# Patient Record
Sex: Male | Born: 1970 | Race: Black or African American | Hispanic: No | Marital: Single | Smoking: Former smoker
Health system: Southern US, Community
[De-identification: ages and names within clinical notes are randomized; demographics above are authoritative.]

## PROBLEM LIST (undated history)

## (undated) DIAGNOSIS — I10 Essential (primary) hypertension: Secondary | ICD-10-CM

---

## 1999-03-23 ENCOUNTER — Encounter: Payer: Self-pay | Admitting: Emergency Medicine

## 1999-03-23 ENCOUNTER — Emergency Department (HOSPITAL_COMMUNITY): Admission: EM | Admit: 1999-03-23 | Discharge: 1999-03-23 | Payer: Self-pay | Admitting: Emergency Medicine

## 1999-03-29 ENCOUNTER — Emergency Department (HOSPITAL_COMMUNITY): Admission: EM | Admit: 1999-03-29 | Discharge: 1999-03-29 | Payer: Self-pay | Admitting: Emergency Medicine

## 2000-02-28 ENCOUNTER — Emergency Department (HOSPITAL_COMMUNITY): Admission: EM | Admit: 2000-02-28 | Discharge: 2000-02-28 | Payer: Self-pay | Admitting: Emergency Medicine

## 2002-05-19 ENCOUNTER — Emergency Department (HOSPITAL_COMMUNITY): Admission: EM | Admit: 2002-05-19 | Discharge: 2002-05-19 | Payer: Self-pay | Admitting: Emergency Medicine

## 2006-04-04 ENCOUNTER — Encounter: Payer: Self-pay | Admitting: Emergency Medicine

## 2011-03-11 ENCOUNTER — Emergency Department (HOSPITAL_COMMUNITY)
Admission: EM | Admit: 2011-03-11 | Discharge: 2011-03-11 | Disposition: A | Discharge status: Home / Self Care | Payer: Self-pay | Attending: Emergency Medicine | Admitting: Emergency Medicine

## 2011-03-11 DIAGNOSIS — M542 Cervicalgia: Secondary | ICD-10-CM | POA: Insufficient documentation

## 2011-03-11 DIAGNOSIS — Y929 Unspecified place or not applicable: Secondary | ICD-10-CM | POA: Insufficient documentation

## 2011-03-11 DIAGNOSIS — Z1810 Retained metal fragments, unspecified: Secondary | ICD-10-CM | POA: Insufficient documentation

## 2011-03-11 DIAGNOSIS — X58XXXA Exposure to other specified factors, initial encounter: Secondary | ICD-10-CM | POA: Insufficient documentation

## 2011-03-11 DIAGNOSIS — S1190XA Unspecified open wound of unspecified part of neck, initial encounter: Secondary | ICD-10-CM | POA: Insufficient documentation

## 2013-02-28 ENCOUNTER — Emergency Department (HOSPITAL_COMMUNITY)
Admission: EM | Admit: 2013-02-28 | Discharge: 2013-02-28 | Disposition: A | Discharge status: Home / Self Care | Payer: No Typology Code available for payment source | Attending: Emergency Medicine | Admitting: Emergency Medicine

## 2013-02-28 ENCOUNTER — Encounter (HOSPITAL_COMMUNITY): Payer: Self-pay | Admitting: *Deleted

## 2013-02-28 DIAGNOSIS — S0093XA Contusion of unspecified part of head, initial encounter: Secondary | ICD-10-CM

## 2013-02-28 DIAGNOSIS — S4980XA Other specified injuries of shoulder and upper arm, unspecified arm, initial encounter: Secondary | ICD-10-CM | POA: Insufficient documentation

## 2013-02-28 DIAGNOSIS — F17200 Nicotine dependence, unspecified, uncomplicated: Secondary | ICD-10-CM | POA: Insufficient documentation

## 2013-02-28 DIAGNOSIS — S46909A Unspecified injury of unspecified muscle, fascia and tendon at shoulder and upper arm level, unspecified arm, initial encounter: Secondary | ICD-10-CM | POA: Insufficient documentation

## 2013-02-28 DIAGNOSIS — Y92410 Unspecified street and highway as the place of occurrence of the external cause: Secondary | ICD-10-CM | POA: Insufficient documentation

## 2013-02-28 DIAGNOSIS — S0003XA Contusion of scalp, initial encounter: Secondary | ICD-10-CM | POA: Insufficient documentation

## 2013-02-28 DIAGNOSIS — M25511 Pain in right shoulder: Secondary | ICD-10-CM

## 2013-02-28 DIAGNOSIS — Y9389 Activity, other specified: Secondary | ICD-10-CM | POA: Insufficient documentation

## 2013-02-28 MED ORDER — CYCLOBENZAPRINE HCL 10 MG PO TABS: 10.0000 mg | ORAL_TABLET | Freq: Two times a day (BID) | ORAL | Status: DC | PRN
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
## 2013-03-01 NOTE — ED Provider Notes (Signed)
Medical screening examination/treatment/procedure(s) were performed by non-physician practitioner and as supervising physician I was immediately available for consultation/collaboration.   Marquis Diles III, MD 03/01/13 1129 

## 2014-07-03 ENCOUNTER — Encounter (HOSPITAL_COMMUNITY): Payer: Self-pay | Admitting: Emergency Medicine

## 2014-07-03 ENCOUNTER — Emergency Department (HOSPITAL_COMMUNITY): Payer: No Typology Code available for payment source

## 2014-07-03 ENCOUNTER — Emergency Department (HOSPITAL_COMMUNITY)
Admission: EM | Admit: 2014-07-03 | Discharge: 2014-07-03 | Disposition: A | Discharge status: Home / Self Care | Payer: No Typology Code available for payment source | Attending: Emergency Medicine | Admitting: Emergency Medicine

## 2014-07-03 DIAGNOSIS — F17200 Nicotine dependence, unspecified, uncomplicated: Secondary | ICD-10-CM | POA: Insufficient documentation

## 2014-07-03 DIAGNOSIS — I10 Essential (primary) hypertension: Secondary | ICD-10-CM | POA: Insufficient documentation

## 2014-07-03 DIAGNOSIS — M25519 Pain in unspecified shoulder: Secondary | ICD-10-CM | POA: Insufficient documentation

## 2014-07-03 DIAGNOSIS — M25512 Pain in left shoulder: Secondary | ICD-10-CM

## 2014-07-03 HISTORY — DX: Essential (primary) hypertension: I10

## 2014-07-03 MED ORDER — HYDROCODONE-ACETAMINOPHEN 5-325 MG PO TABS: 2.0000 | ORAL_TABLET | ORAL | Status: DC | PRN

## 2014-07-03 MED ORDER — IBUPROFEN 800 MG PO TABS: 800.0000 mg | ORAL_TABLET | Freq: Three times a day (TID) | ORAL | Status: DC

## 2014-07-03 NOTE — ED Provider Notes (Signed)
CSN: [id number]     Arrival date & time 07/03/14  2025 History   First MD Initiated Contact with Patient 07/03/14 2136     Chief Complaint  Patient presents with  . Shoulder Pain     (Consider location/radiation/quality/duration/timing/severity/associated sxs/prior Treatment) Patient is a 43 y.o. male presenting with shoulder pain. The history is provided by the patient. No language interpreter was used.  Shoulder Pain This is a new problem. The current episode started today. The problem occurs constantly. The problem has been unchanged. Nothing aggravates the symptoms. He has tried nothing for the symptoms. The treatment provided no relief.    Past Medical History  Diagnosis Date  . Hypertension    History reviewed. No pertinent past surgical history. History reviewed. No pertinent family history. History  Substance Use Topics  . Smoking status: Current Some Day Smoker  . Smokeless tobacco: Not on file  . Alcohol Use: No     Comment: occ    Review of Systems  All other systems reviewed and are negative.     Allergies  Shellfish allergy  Home Medications   Prior to Admission medications   Not on File   BP 145/88  Pulse 77  Temp(Src) 98.9 F (37.2 C) (Oral)  Resp 18  Ht 6\' 1"  (1.854 m)  Wt 221 lb (100.245 kg)  BMI 29.16 kg/m2  SpO2 100% Physical Exam  Nursing note and vitals reviewed. Constitutional: He is oriented to person, place, and time. He appears well-developed and well-nourished.  Musculoskeletal: He exhibits tenderness.  Neurological: He is alert and oriented to person, place, and time.  Skin: Skin is warm.  Psychiatric: He has a normal mood and affect.    ED Course  Procedures (including critical care time) Labs Review Labs Reviewed - No data to display  Imaging Review Dg Shoulder Left  07/03/2014   CLINICAL DATA:  Shoulder pain  EXAM: LEFT SHOULDER - 2+ VIEW  COMPARISON:  None.  FINDINGS: No fracture or dislocation is seen.  The joint  spaces are preserved.  The visualized soft tissues are unremarkable.  Visualized left lung is clear.  IMPRESSION: No fracture or dislocation is seen.   Electronically Signed   By: Charline BillsSriyesh  Krishnan M.D.   On: 07/03/2014 21:03     EKG Interpretation None      MDM   Final diagnoses:  Shoulder pain, left    Hydrocodone ibuprofen    Elson AreasLeslie K Dessire Grimes, PA-C 07/03/14 2204

## 2014-07-03 NOTE — Discharge Instructions (Signed)

## 2014-07-03 NOTE — ED Notes (Signed)
Pain lt shoulder , onset today Hurts with movement.  Good radial pulse , good grip.No known injury

## 2014-07-03 NOTE — ED Notes (Signed)
Left shoulder pain when pt raises arm or rotates shoulder. States he was not lifting or straining when pain started.

## 2014-07-04 NOTE — ED Provider Notes (Signed)
Medical screening examination/treatment/procedure(s) were performed by non-physician practitioner and as supervising physician I was immediately available for consultation/collaboration.   EKG Interpretation None        Glynn OctaveStephen Lillee Mooneyhan, MD 07/04/14 [phone]

## 2016-08-29 ENCOUNTER — Encounter (HOSPITAL_COMMUNITY): Payer: Self-pay | Admitting: *Deleted

## 2016-08-29 ENCOUNTER — Emergency Department (HOSPITAL_COMMUNITY)
Admission: EM | Admit: 2016-08-29 | Discharge: 2016-08-29 | Disposition: A | Discharge status: Home / Self Care | Payer: BLUE CROSS/BLUE SHIELD | Attending: Emergency Medicine | Admitting: Emergency Medicine

## 2016-08-29 ENCOUNTER — Emergency Department (HOSPITAL_COMMUNITY): Payer: BLUE CROSS/BLUE SHIELD

## 2016-08-29 DIAGNOSIS — Y990 Civilian activity done for income or pay: Secondary | ICD-10-CM | POA: Insufficient documentation

## 2016-08-29 DIAGNOSIS — F17200 Nicotine dependence, unspecified, uncomplicated: Secondary | ICD-10-CM | POA: Diagnosis not present

## 2016-08-29 DIAGNOSIS — I10 Essential (primary) hypertension: Secondary | ICD-10-CM | POA: Insufficient documentation

## 2016-08-29 DIAGNOSIS — M25512 Pain in left shoulder: Secondary | ICD-10-CM | POA: Diagnosis present

## 2016-08-29 DIAGNOSIS — X503XXA Overexertion from repetitive movements, initial encounter: Secondary | ICD-10-CM | POA: Diagnosis not present

## 2016-08-29 DIAGNOSIS — Y929 Unspecified place or not applicable: Secondary | ICD-10-CM | POA: Insufficient documentation

## 2016-08-29 DIAGNOSIS — S46912A Strain of unspecified muscle, fascia and tendon at shoulder and upper arm level, left arm, initial encounter: Secondary | ICD-10-CM

## 2016-08-29 DIAGNOSIS — Y939 Activity, unspecified: Secondary | ICD-10-CM | POA: Diagnosis not present

## 2016-08-29 MED ORDER — TRAMADOL HCL 50 MG PO TABS: 50.0000 mg | ORAL_TABLET | Freq: Four times a day (QID) | ORAL | 0 refills | Status: DC | PRN

## 2016-08-29 MED ORDER — NAPROXEN 500 MG PO TABS
500.0000 mg | ORAL_TABLET | Freq: Two times a day (BID) | ORAL | 0 refills | Status: DC
Start: 2016-08-29 — End: 2017-12-10

## 2016-08-29 NOTE — ED Provider Notes (Signed)
[facility] DEPT Provider Note   CSN: [id number] Arrival date & time: 08/29/16  2048     History   Chief Complaint Chief Complaint  Patient presents with  . Shoulder Pain    HPI Tristan Schaefer is a 45 y.o. male presenting with left shoulder pain which he woke with yesterday morning.  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  He had similar sx 2 years ago which improved with rest and anti inflammatories.  He denies numbness or weakness in his arms or hands and is right handed.  He denies neck and chest pain, denies sob and back pain.  Pain improves at rest and is worsened with arm lifting.  The history is provided by the patient.    Past Medical History:  Diagnosis Date  . Hypertension     There are no active problems to display for this patient.   History reviewed. No pertinent surgical history.     Home Medications    Prior to Admission medications   Medication Sig Start Date End Date Taking? Authorizing Provider  HYDROcodone-acetaminophen (NORCO/VICODIN) 5-325 MG per tablet Take 2 tablets by mouth every 4 (four) hours as needed. 07/03/14   Elson Areas, PA-C  ibuprofen (ADVIL,MOTRIN) 800 MG tablet Take 1 tablet (800 mg total) by mouth 3 (three) times daily. 07/03/14   Elson Areas, PA-C  naproxen (NAPROSYN) 500 MG tablet Take 1 tablet (500 mg total) by mouth 2 (two) times daily. 08/29/16   Burgess Amor, PA-C  traMADol (ULTRAM) 50 MG tablet Take 1 tablet (50 mg total) by mouth every 6 (six) hours as needed for moderate pain. 08/29/16   Burgess Amor, PA-C    Family History History reviewed. No pertinent family history.  Social History Social History  Substance Use Topics  . Smoking status: Current Some Day Smoker  . Smokeless tobacco: Current User  . Alcohol use No     Comment: occ     Allergies   Shellfish allergy   Review of Systems Review of Systems   Constitutional: Negative for fever.  Respiratory: Negative for chest tightness and shortness of breath.   Cardiovascular: Negative for chest pain.  Musculoskeletal: Positive for arthralgias. Negative for back pain, joint swelling and myalgias.  Neurological: Negative for weakness and numbness.     Physical Exam Updated Vital Signs BP 127/82 (BP Location: Left Arm)   Pulse (!) 50   Temp 99.2 F (37.3 C) (Oral)   Resp 18   Ht 6\' 1"  (1.854 m)   Wt 99.8 kg   SpO2 99%   BMI 29.03 kg/m   Physical Exam  Constitutional: He appears well-developed and well-nourished.  HENT:  Head: Atraumatic.  Neck: Normal range of motion.  Cardiovascular:  Pulses equal bilaterally  Musculoskeletal: He exhibits tenderness.       Left shoulder: He exhibits tenderness. He exhibits no swelling, no effusion, no spasm, normal pulse and normal strength.  ttp over left deltoid at the origin.  No palpable deformity.  Pain with active but not passive ROM.  No crepitus or edema.   Neurological: He is alert. He has normal strength. He displays normal reflexes. No sensory deficit.  Skin: Skin is warm and dry.  Psychiatric: He has a normal mood and affect.     ED Treatments / Results  Labs (all labs ordered are listed, but only abnormal results are displayed)  Labs Reviewed - No data to display  EKG  EKG Interpretation None       Radiology Dg Shoulder Left  Result Date: 08/30/2016 CLINICAL DATA:  45 year old male with left shoulder pain. EXAM: LEFT SHOULDER - 2+ VIEW COMPARISON:  Radiograph dated 07/03/2014 FINDINGS: There is no evidence of fracture or dislocation. There is no evidence of arthropathy or other focal bone abnormality. Soft tissues are unremarkable. IMPRESSION: Negative. Electronically Signed   By: Elgie CollardArash  Radparvar M.D.   On: 08/30/2016 00:51          Procedures Procedures (including critical care time)  Medications Ordered in ED Medications - No data to display   Initial  Impression / Assessment and Plan / ED Course  I have reviewed the triage vital signs and the nursing notes.  Pertinent labs & imaging results that were available during my care of the patient were reviewed by me and considered in my medical decision making (see chart for details).  Clinical Course    Suspect deltoid tendon strain, probably from chronic overuse. Naproxen, ice tx, tramadol.  Referral to ortho for f/u care if sx persist or worsen.   Final Clinical Impressions(s) / ED Diagnoses   Final diagnoses:  Left shoulder strain, initial encounter    New Prescriptions New Prescriptions   NAPROXEN (NAPROSYN) 500 MG TABLET    Take 1 tablet (500 mg total) by mouth 2 (two) times daily.   TRAMADOL (ULTRAM) 50 MG TABLET    Take 1 tablet (50 mg total) by mouth every 6 (six) hours as needed for moderate pain.     Burgess AmorJulie Mikaila Grunert, PA-C 08/30/16 0112    Shaune Pollackameron Isaacs, MD 08/30/16 [phone]

## 2016-08-29 NOTE — ED Triage Notes (Signed)
Pt reports 6/10 pain to left shoulder without known injury x 2 days.  Pt says should just started hurting. Pt states hurts worse when elevated.

## 2016-08-29 NOTE — Discharge Instructions (Signed)
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  Use the medicines prescribed and also try applying ice to your shoulder for 10 minute increments every few hours for the next several days.  Avoid activities that worsen your pain. Your xrays are negative tonight for bony injury, arthritis or other visible injuries in your shoulder.

## 2017-01-11 ENCOUNTER — Encounter (HOSPITAL_COMMUNITY): Payer: Self-pay | Admitting: Emergency Medicine

## 2017-01-11 ENCOUNTER — Emergency Department (HOSPITAL_COMMUNITY)
Admission: EM | Admit: 2017-01-11 | Discharge: 2017-01-11 | Disposition: A | Discharge status: Home / Self Care | Payer: BLUE CROSS/BLUE SHIELD | Attending: Emergency Medicine | Admitting: Emergency Medicine

## 2017-01-11 DIAGNOSIS — F17200 Nicotine dependence, unspecified, uncomplicated: Secondary | ICD-10-CM | POA: Insufficient documentation

## 2017-01-11 DIAGNOSIS — K029 Dental caries, unspecified: Secondary | ICD-10-CM | POA: Insufficient documentation

## 2017-01-11 DIAGNOSIS — I10 Essential (primary) hypertension: Secondary | ICD-10-CM | POA: Diagnosis not present

## 2017-01-11 DIAGNOSIS — K0889 Other specified disorders of teeth and supporting structures: Secondary | ICD-10-CM | POA: Diagnosis present

## 2017-01-11 MED ORDER — KETOROLAC TROMETHAMINE 30 MG/ML IJ SOLN
30.0000 mg | Freq: Once | INTRAMUSCULAR | Status: AC
  Administered 2017-01-11: 30 mg via INTRAVENOUS
  Filled 2017-01-11: qty 1

## 2017-01-11 MED ORDER — OXYCODONE-ACETAMINOPHEN 5-325 MG PO TABS
1.0000 | ORAL_TABLET | Freq: Once | ORAL | Status: AC
  Administered 2017-01-11: 1 via ORAL
  Filled 2017-01-11: qty 1

## 2017-01-11 MED ORDER — HYDROCODONE-ACETAMINOPHEN 5-325 MG PO TABS: ORAL_TABLET | ORAL | 0 refills | Status: DC

## 2017-01-11 MED ORDER — CLINDAMYCIN PHOSPHATE 600 MG/50ML IV SOLN
600.0000 mg | Freq: Once | INTRAVENOUS | Status: AC
  Administered 2017-01-11: 600 mg via INTRAVENOUS
  Filled 2017-01-11: qty 50

## 2017-01-11 MED ORDER — CLINDAMYCIN HCL 300 MG PO CAPS: 300.0000 mg | ORAL_CAPSULE | Freq: Four times a day (QID) | ORAL | 0 refills | Status: DC

## 2017-01-11 MED ORDER — IBUPROFEN 800 MG PO TABS: 800.0000 mg | ORAL_TABLET | Freq: Three times a day (TID) | ORAL | 0 refills | Status: DC

## 2017-01-11 NOTE — ED Triage Notes (Signed)
C/o dental pain to right upper tooth since Monday, rates pain 7/10.  Area swollen and tender.

## 2017-01-11 NOTE — Discharge Instructions (Signed)
Follow-up with a dentist soon.   °

## 2017-01-11 NOTE — ED Provider Notes (Signed)
[facility] Provider Note   CSN: [id number] Arrival date & time: 01/11/17  1538     History   Chief Complaint Chief Complaint  Patient presents with  . Dental Pain    right upper    HPI Tristan Schaefer is a 46 y.o. male.  HPI  Tristan Schaefer is a 46 y.o. male who presents to the Emergency Department complaining of dental pain and facial swelling.  Reports having increasing pain to his upper right front tooth.  Increasing swelling to his upper lip and right face.  He also complains of having a foul taste in his mouth.  Pain to his front teeth with chewing.  He denies fever, chills, neck pain or difficulty opening or closing his mouth.  Does not have a dentist.   Past Medical History:  Diagnosis Date  . Hypertension     There are no active problems to display for this patient.   History reviewed. No pertinent surgical history.     Home Medications    Prior to Admission medications   Medication Sig Start Date End Date Taking? Authorizing Provider  HYDROcodone-acetaminophen (NORCO/VICODIN) 5-325 MG per tablet Take 2 tablets by mouth every 4 (four) hours as needed. 07/03/14   Elson Areas, PA-C  ibuprofen (ADVIL,MOTRIN) 800 MG tablet Take 1 tablet (800 mg total) by mouth 3 (three) times daily. 07/03/14   Elson Areas, PA-C  naproxen (NAPROSYN) 500 MG tablet Take 1 tablet (500 mg total) by mouth 2 (two) times daily. 08/29/16   Burgess Amor, PA-C  traMADol (ULTRAM) 50 MG tablet Take 1 tablet (50 mg total) by mouth every 6 (six) hours as needed for moderate pain. 08/29/16   Burgess Amor, PA-C    Family History History reviewed. No pertinent family history.  Social History Social History  Substance Use Topics  . Smoking status: Current Some Day Smoker  . Smokeless tobacco: Current User  . Alcohol use No     Comment: occ     Allergies   Shellfish allergy   Review of Systems Review of Systems  Constitutional: Negative for appetite change and fever.  HENT:  Positive for dental problem and facial swelling. Negative for congestion, sore throat and trouble swallowing.   Eyes: Negative for pain and visual disturbance.  Musculoskeletal: Negative for neck pain and neck stiffness.  Neurological: Negative for dizziness, facial asymmetry and headaches.  Hematological: Negative for adenopathy.  All other systems reviewed and are negative.    Physical Exam Updated Vital Signs BP 148/93 (BP Location: Left Arm)   Pulse (!) 56   Temp 99.1 F (37.3 C) (Oral)   Resp 16   Ht 6\' 1"  (1.854 m)   Wt 102.5 kg   SpO2 99%   BMI 29.82 kg/m   Physical Exam  Constitutional: He is oriented to person, place, and time. He appears well-developed and well-nourished. No distress.  HENT:  Head: Normocephalic and atraumatic.  Right Ear: Tympanic membrane and ear canal normal.  Left Ear: Tympanic membrane and ear canal normal.  Mouth/Throat: Uvula is midline, oropharynx is clear and moist and mucous membranes are normal. No trismus in the jaw. Dental caries present. No dental abscesses or uvula swelling.  tenderness and dental caries of the right upper central incisor.  Moderate facial edema to just below the right nostril and extending to the nasolabial fold. No fluctuance of the gingiva,  trismus, or sublingual abnml.    Neck: Normal range of motion. Neck supple.  Cardiovascular:  Normal rate, regular rhythm and normal heart sounds.   No murmur heard. Pulmonary/Chest: Effort normal and breath sounds normal.  Musculoskeletal: Normal range of motion.  Lymphadenopathy:    He has no cervical adenopathy.  Neurological: He is alert and oriented to person, place, and time. He exhibits normal muscle tone. Coordination normal.  Skin: Skin is warm and dry.  Nursing note and vitals reviewed.    ED Treatments / Results  Labs (all labs ordered are listed, but only abnormal results are displayed) Labs Reviewed - No data to display  EKG  EKG Interpretation None        Radiology No results found.  Procedures Procedures (including critical care time)  Medications Ordered in ED Medications  clindamycin (CLEOCIN) IVPB 600 mg (0 mg Intravenous Stopped 01/11/17 1848)  ketorolac (TORADOL) 30 MG/ML injection 30 mg (30 mg Intravenous Given 01/11/17 1805)  oxyCODONE-acetaminophen (PERCOCET/ROXICET) 5-325 MG per tablet 1 tablet (1 tablet Oral Given 01/11/17 1805)     Initial Impression / Assessment and Plan / ED Course  I have reviewed the triage vital signs and the nursing notes.  Pertinent labs & imaging results that were available during my care of the patient were reviewed by me and considered in my medical decision making (see chart for details).     Pt with a dental caries and likely dental abscess, nothing to I&D.  Airway is patent.  No concerning sx's for Ludwig's angina.  IV clinda given.  Pt appears stable for d/c.  Agrees to close dental f/u  Final Clinical Impressions(s) / ED Diagnoses   Final diagnoses:  Pain, dental    New Prescriptions Discharge Medication List as of 01/11/2017  6:46 PM    START taking these medications   Details  clindamycin (CLEOCIN) 300 MG capsule Take 1 capsule (300 mg total) by mouth 4 (four) times daily., Starting Thu 01/11/2017, [employer]         Subrena Devereux, PA-C 01/13/17 2220    Marily MemosJason Mesner, MD 01/22/17 [phone]

## 2017-12-10 ENCOUNTER — Encounter (HOSPITAL_COMMUNITY): Payer: Self-pay | Admitting: Emergency Medicine

## 2017-12-10 ENCOUNTER — Other Ambulatory Visit: Payer: Self-pay

## 2017-12-10 ENCOUNTER — Emergency Department (HOSPITAL_COMMUNITY)
Admission: EM | Admit: 2017-12-10 | Discharge: 2017-12-11 | Disposition: A | Discharge status: Home / Self Care | Payer: BLUE CROSS/BLUE SHIELD | Attending: Emergency Medicine | Admitting: Emergency Medicine

## 2017-12-10 ENCOUNTER — Emergency Department (HOSPITAL_COMMUNITY): Payer: BLUE CROSS/BLUE SHIELD

## 2017-12-10 DIAGNOSIS — J069 Acute upper respiratory infection, unspecified: Secondary | ICD-10-CM | POA: Insufficient documentation

## 2017-12-10 DIAGNOSIS — R0789 Other chest pain: Secondary | ICD-10-CM | POA: Insufficient documentation

## 2017-12-10 DIAGNOSIS — I10 Essential (primary) hypertension: Secondary | ICD-10-CM | POA: Insufficient documentation

## 2017-12-10 DIAGNOSIS — F17200 Nicotine dependence, unspecified, uncomplicated: Secondary | ICD-10-CM | POA: Insufficient documentation

## 2017-12-10 DIAGNOSIS — B349 Viral infection, unspecified: Secondary | ICD-10-CM | POA: Diagnosis not present

## 2017-12-10 DIAGNOSIS — R079 Chest pain, unspecified: Secondary | ICD-10-CM | POA: Diagnosis present

## 2017-12-10 LAB — BASIC METABOLIC PANEL
ANION GAP: 10 (ref 5–15)
BUN: 10 mg/dL (ref 6–20)
CHLORIDE: 105 mmol/L (ref 101–111)
CO2: 26 mmol/L (ref 22–32)
Calcium: 9.1 mg/dL (ref 8.9–10.3)
Creatinine, Ser: 1 mg/dL (ref 0.61–1.24)
GFR calc Af Amer: >60 mL/min (ref >60)
Glucose, Bld: 101 mg/dL — ABNORMAL HIGH (ref 65–99)
POTASSIUM: 3.8 mmol/L (ref 3.5–5.1)
SODIUM: 141 mmol/L (ref 135–145)

## 2017-12-10 LAB — CBC
HEMATOCRIT: 42.8 % (ref 39.0–52.0)
HEMOGLOBIN: 14.1 g/dL (ref 13.0–17.0)
MCH: 28.7 pg (ref 26.0–34.0)
MCHC: 32.9 g/dL (ref 30.0–36.0)
MCV: 87.2 fL (ref 78.0–100.0)
Platelets: 228 10*3/uL (ref 150–400)
RBC: 4.91 MIL/uL (ref 4.22–5.81)
RDW: 14 % (ref 11.5–15.5)
WBC: 7.6 10*3/uL (ref 4.0–10.5)

## 2017-12-10 LAB — TROPONIN I: Troponin I: <0.03 ng/mL (ref <0.03)

## 2017-12-10 MED ORDER — KETOROLAC TROMETHAMINE 30 MG/ML IJ SOLN
30.0000 mg | Freq: Once | INTRAMUSCULAR | Status: DC
  Filled 2017-12-10: qty 1

## 2017-12-10 NOTE — ED Provider Notes (Signed)
[facility] EMERGENCY DEPARTMENT Provider Note   CSN: [id number] Arrival date & time: 12/10/17  2038     History   Chief Complaint Chief Complaint  Patient presents with  . Chest Pain    HPI Tristan Schaefer is a 47 y.o. male.  HPI  This 47 year old male who presents with chest pain.  Patient reports onset of chest pain yesterday.  He reports that it is right-sided radiates into his right arm.  It is been somewhat constant.  Better today than yesterday.  Not worse with exertion.  No modifying or alleviating factors.  It is somewhat pleuritic in nature.  Does report 1 week history of increasing respiratory congestion.  Reports cough that is nonproductive.  No fevers.  Denies history of high blood pressure high cholesterol, diabetes.  No history of long travel, hospitalization, leg swelling, history of blood clots.  Past Medical History:  Diagnosis Date  . Hypertension     There are no active problems to display for this patient.   History reviewed. No pertinent surgical history.     Home Medications    Prior to Admission medications   Medication Sig Start Date End Date Taking? Authorizing Provider  Diphenhydramine-PE-APAP (NIGHTTIME COLD & FLU MAX STR) 12.5-5-325 MG/10ML LIQD Take 15 mLs by mouth at bedtime as needed (for cold and flu symptoms).   Yes [provider]  naproxen (NAPROSYN) 500 MG tablet Take 1 tablet (500 mg total) by mouth 2 (two) times daily. 12/11/17   Nahal Wanless, Mayer Masker, MD    Family History History reviewed. No pertinent family history.  Social History Social History   Tobacco Use  . Smoking status: Current Some Day Smoker  . Smokeless tobacco: Current User  Substance Use Topics  . Alcohol use: No    Comment: occ  . Drug use: Yes    Types: Marijuana     Allergies   Shellfish allergy   Review of Systems Review of Systems  Constitutional: Negative for fever.  Respiratory: Positive for cough. Negative for shortness of breath.     Cardiovascular: Positive for chest pain. Negative for leg swelling.  Gastrointestinal: Negative for abdominal pain, nausea and vomiting.  All other systems reviewed and are negative.    Physical Exam Updated Vital Signs BP 121/89   Pulse (!) 58   Resp 16   Ht 6\' 1"  (1.854 m)   Wt 102.1 kg (225 lb)   SpO2 99%   BMI 29.69 kg/m   Physical Exam  Constitutional: He is oriented to person, place, and time. He appears well-developed and well-nourished.  HENT:  Head: Normocephalic and atraumatic.  Eyes: Pupils are equal, round, and reactive to light.  Neck: Neck supple.  Cardiovascular: Normal rate, normal heart sounds and normal pulses. An irregularly irregular rhythm present.  No murmur heard. Pulmonary/Chest: Effort normal and breath sounds normal. No respiratory distress. He has no wheezes.  Abdominal: Soft. Bowel sounds are normal. There is no tenderness. There is no rebound.  Musculoskeletal: He exhibits no edema.       Right lower leg: He exhibits no edema.       Left lower leg: He exhibits no edema.  Lymphadenopathy:    He has no cervical adenopathy.  Neurological: He is alert and oriented to person, place, and time.  Skin: Skin is warm and dry.  Psychiatric: He has a normal mood and affect.  Nursing note and vitals reviewed.    ED Treatments / Results  Labs (all labs ordered  are listed, but only abnormal results are displayed) Labs Reviewed  BASIC METABOLIC PANEL - Abnormal; Notable for the following components:      Result Value   Glucose, Bld 101 (*)    All other components within normal limits  CBC  TROPONIN I  TROPONIN I  D-DIMER, QUANTITATIVE (NOT AT [facility])    EKG  EKG Interpretation  Date/Time:  Monday December 10 2017 20:47:32 EST Ventricular Rate:  54 PR Interval:  140 QRS Duration: 94 QT Interval:  414 QTC Calculation: 392 R Axis:   97 Text Interpretation:  Sinus rhythm with Blocked Premature atrial complexes Rightward axis Nonspecific T wave  abnormality Abnormal ECG Confirmed by Ross MarcusHorton, Vernetta Dizdarevic ([id number]) on 12/10/2017 10:49:36 PM       Radiology Dg Chest 2 View  Result Date: 12/10/2017 CLINICAL DATA:  Right-sided chest pain beneath the breast extending into the right arm. Dyspnea and cough. EXAM: CHEST  2 VIEW COMPARISON:  None available on PACs FINDINGS: The heart size and mediastinal contours are within normal limits. Both lungs are clear. The visualized skeletal structures are unremarkable. IMPRESSION: No active cardiopulmonary disease. Electronically Signed   By: Tollie Ethavid  Kwon M.D.   On: 12/10/2017 21:03    Procedures Procedures (including critical care time)  Medications Ordered in ED Medications  ketorolac (TORADOL) 30 MG/ML injection 30 mg (30 mg Intramuscular Refused 12/10/17 2345)  naproxen (NAPROSYN) tablet 500 mg (not administered)     Initial Impression / Assessment and Plan / ED Course  I have reviewed the triage vital signs and the nursing notes.  Pertinent labs & imaging results that were available during my care of the patient were reviewed by me and considered in my medical decision making (see chart for details).     Patient presents with chest pain.  Ongoing since yesterday.  Does report some upper respiratory symptoms and cough.  He is overall nontoxic appearing.  Vital signs reassuring.  He is in sinus rhythm and sinus bradycardia on the monitor with PACs.  No significant evidence of ischemia on EKGs.  Doubt ACS as he is low risk.  Initial troponin and repeat troponin are negative.  Given pleuritic nature and no obvious infiltrate on chest x-ray, d-dimer was sent.  This is negative.  Low suspicion for PE.  Will treat with a trial of naproxen.  After history, exam, and medical workup I feel the patient has been appropriately medically screened and is safe for discharge home. Pertinent diagnoses were discussed with the patient. Patient was given return precautions.   Final Clinical Impressions(s) / ED  Diagnoses   Final diagnoses:  Atypical chest pain  Viral upper respiratory tract infection    ED Discharge Orders        Ordered    naproxen (NAPROSYN) 500 MG tablet  2 times daily     12/11/17 0105       Rondale Nies, Mayer Maskerourtney F, MD 12/11/17 [phone]

## 2017-12-10 NOTE — ED Triage Notes (Addendum)
Pt c/o of right sided chest pain starting yesterday. Pt has had a productive cough x3 days. No fever. Pt states worsening pain when taking a deep breath.

## 2017-12-11 LAB — TROPONIN I: Troponin I: <0.03 ng/mL (ref <0.03)

## 2017-12-11 LAB — D-DIMER, QUANTITATIVE (NOT AT ARMC)

## 2017-12-11 MED ORDER — NAPROXEN 500 MG PO TABS: 500.0000 mg | ORAL_TABLET | Freq: Two times a day (BID) | ORAL | 0 refills | Status: DC

## 2017-12-11 MED ORDER — NAPROXEN 250 MG PO TABS
500.0000 mg | ORAL_TABLET | Freq: Once | ORAL | Status: AC
  Administered 2017-12-11: 500 mg via ORAL
  Filled 2017-12-11: qty 2

## 2017-12-11 NOTE — Discharge Instructions (Addendum)
He was seen today for chest pain.  This may be related to your upper respiratory symptoms and chest wall inflammation.  Try naproxen.  Monitor symptoms for several days.  If you have any new or worsening symptoms you should be reevaluated.

## 2018-10-08 ENCOUNTER — Emergency Department (HOSPITAL_COMMUNITY)
Admission: EM | Admit: 2018-10-08 | Discharge: 2018-10-08 | Disposition: A | Discharge status: Home / Self Care | Payer: BLUE CROSS/BLUE SHIELD | Attending: Emergency Medicine | Admitting: Emergency Medicine

## 2018-10-08 ENCOUNTER — Other Ambulatory Visit: Payer: Self-pay

## 2018-10-08 ENCOUNTER — Encounter (HOSPITAL_COMMUNITY): Payer: Self-pay | Admitting: Emergency Medicine

## 2018-10-08 DIAGNOSIS — I10 Essential (primary) hypertension: Secondary | ICD-10-CM | POA: Insufficient documentation

## 2018-10-08 DIAGNOSIS — M545 Low back pain, unspecified: Secondary | ICD-10-CM

## 2018-10-08 DIAGNOSIS — F17210 Nicotine dependence, cigarettes, uncomplicated: Secondary | ICD-10-CM | POA: Insufficient documentation

## 2018-10-08 DIAGNOSIS — Z79899 Other long term (current) drug therapy: Secondary | ICD-10-CM | POA: Insufficient documentation

## 2018-10-08 MED ORDER — IBUPROFEN 600 MG PO TABS: 600.0000 mg | ORAL_TABLET | Freq: Four times a day (QID) | ORAL | 0 refills | Status: DC | PRN

## 2018-10-08 NOTE — ED Provider Notes (Signed)
[facility] EMERGENCY DEPARTMENT Provider Note   CSN: [id number] Arrival date & time: 10/08/18  1926     History   Chief Complaint Chief Complaint  Patient presents with  . Back Pain    HPI Tristan Schaefer is a 47 y.o. male who presents with low back pain. He states that it started yesterday and has gradually worsened today. He isn't sure what is wrong. 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 The pain is across the lower back but worse in the middle. He took 400mg  Ibuprofen at home. He decided to come to the ED because he was worried about a pinched nerve. No radiation of pain in to the legs. No fever, syncope, trauma, unexplained weight loss, hx of cancer, loss of bowel/bladder function, saddle anesthesia, urinary retention, IVDU.  HPI  Past Medical History:  Diagnosis Date  . Hypertension     There are no active problems to display for this patient.   History reviewed. No pertinent surgical history.      Home Medications    Prior to Admission medications   Medication Sig Start Date End Date Taking? Authorizing Provider  Diphenhydramine-PE-APAP (NIGHTTIME COLD & FLU MAX STR) 12.5-5-325 MG/10ML LIQD Take 15 mLs by mouth at bedtime as needed (for cold and flu symptoms).    [provider]  naproxen (NAPROSYN) 500 MG tablet Take 1 tablet (500 mg total) by mouth 2 (two) times daily. 12/11/17   Horton, Mayer Masker, MD    Family History History reviewed. No pertinent family history.  Social History Social History   Tobacco Use  . Smoking status: Current Some Day Smoker  . Smokeless tobacco: Current User  Substance Use Topics  . Alcohol use: No    Comment: occ  . Drug use: Yes    Types: Marijuana     Allergies   Shellfish allergy   Review of Systems Review of Systems  Genitourinary: Negative for difficulty urinating.    Musculoskeletal: Positive for back pain.  Neurological: Negative for weakness and numbness.     Physical Exam Updated Vital Signs BP 132/68 (BP Location: Right Arm)   Pulse (!) 51   Temp 98.6 F (37 C) (Temporal)   Resp 18   Ht 6\' 1"  (1.854 m)   Wt 99.8 kg   SpO2 98%   BMI 29.03 kg/m   Physical Exam  Constitutional: He is oriented to person, place, and time. He appears well-developed and well-nourished. No distress.  HENT:  Head: Normocephalic and atraumatic.  Eyes: Pupils are equal, round, and reactive to light. Conjunctivae are normal. Right eye exhibits no discharge. Left eye exhibits no discharge. No scleral icterus.  Neck: Normal range of motion.  Cardiovascular: Normal rate.  Pulmonary/Chest: Effort normal. No respiratory distress.  Abdominal: He exhibits no distension.  Musculoskeletal:  Back: Inspection: No masses, deformity, or rash Palpation: Mild upper lumbar midline tenderness. No paraspinal muscle tenderness Strength: 5/5 in lower extremities and normal plantar and dorsiflexion Sensation: Intact sensation with light touch in lower extremities bilaterally SLR: Negative seated straight leg raise Gait: Normal gait   Neurological: He is alert and oriented to person, place, and time.  Skin: Skin is warm and dry.  Psychiatric: He has a normal mood and affect. His behavior is normal.  Nursing note and vitals reviewed.    ED Treatments /  Results  Labs (all labs ordered are listed, but only abnormal results are displayed) Labs Reviewed - No data to display  EKG None  Radiology No results found.  Procedures Procedures (including critical care time)  Medications Ordered in ED Medications - No data to display   Initial Impression / Assessment and Plan / ED Course  I have reviewed the triage vital signs and the nursing notes.  Pertinent labs & imaging results that were available during my care of the patient were reviewed by me and considered in my  medical decision making (see chart for details).  47 year old male with atraumatic low back pain for one day. Likely muscle strain. No red flags in history or on exam. Pain seems mild and he has normal strength and is ambulatory. He was advised to continue Ibuprofen. He requests an rx for this. He was offered a muscle relaxer but declined. He was encouraged supportive measures and to return if worsening  Final Clinical Impressions(s) / ED Diagnoses   Final diagnoses:  Acute midline low back pain without sciatica    ED Discharge Orders    None       Bethel Born, PA-C 10/08/18 1954    Samuel Jester, DO 10/12/18 [phone]

## 2018-10-08 NOTE — ED Triage Notes (Signed)
Pt c/o lower back pain for 2 days. Denies injury. No GU/GI sx.

## 2018-10-08 NOTE — Discharge Instructions (Signed)
Take Ibuprofen up to three times daily for pain Use a heating pad or topical medicines (icy hot, etc) for pain Try stretching exercises Return if you are worsening

## 2020-03-09 DIAGNOSIS — E663 Overweight: Secondary | ICD-10-CM | POA: Diagnosis not present

## 2020-03-09 DIAGNOSIS — Z1389 Encounter for screening for other disorder: Secondary | ICD-10-CM | POA: Diagnosis not present

## 2020-03-09 DIAGNOSIS — Z6829 Body mass index (BMI) 29.0-29.9, adult: Secondary | ICD-10-CM | POA: Diagnosis not present

## 2020-03-09 DIAGNOSIS — Z0000 Encounter for general adult medical examination without abnormal findings: Secondary | ICD-10-CM | POA: Diagnosis not present

## 2021-11-21 ENCOUNTER — Emergency Department (HOSPITAL_COMMUNITY)
Admission: EM | Admit: 2021-11-21 | Discharge: 2021-11-21 | Disposition: A | Discharge status: Home / Self Care | Payer: BC Managed Care – PPO | Attending: Emergency Medicine | Admitting: Emergency Medicine

## 2021-11-21 ENCOUNTER — Emergency Department (HOSPITAL_COMMUNITY): Payer: BC Managed Care – PPO

## 2021-11-21 ENCOUNTER — Other Ambulatory Visit: Payer: Self-pay

## 2021-11-21 ENCOUNTER — Encounter (HOSPITAL_COMMUNITY): Payer: Self-pay | Admitting: *Deleted

## 2021-11-21 DIAGNOSIS — R739 Hyperglycemia, unspecified: Secondary | ICD-10-CM | POA: Diagnosis not present

## 2021-11-21 DIAGNOSIS — R0789 Other chest pain: Secondary | ICD-10-CM | POA: Diagnosis not present

## 2021-11-21 DIAGNOSIS — I10 Essential (primary) hypertension: Secondary | ICD-10-CM | POA: Insufficient documentation

## 2021-11-21 DIAGNOSIS — F17210 Nicotine dependence, cigarettes, uncomplicated: Secondary | ICD-10-CM | POA: Diagnosis not present

## 2021-11-21 LAB — BASIC METABOLIC PANEL
Anion gap: 6 (ref 5–15)
BUN: 15 mg/dL (ref 6–20)
CO2: 28 mmol/L (ref 22–32)
Calcium: 8.9 mg/dL (ref 8.9–10.3)
Chloride: 104 mmol/L (ref 98–111)
Creatinine, Ser: 1.11 mg/dL (ref 0.61–1.24)
GFR, Estimated: >60 mL/min (ref >60)
Glucose, Bld: 100 mg/dL — ABNORMAL HIGH (ref 70–99)
Potassium: 4.2 mmol/L (ref 3.5–5.1)
Sodium: 138 mmol/L (ref 135–145)

## 2021-11-21 LAB — CBC WITH DIFFERENTIAL/PLATELET
Abs Immature Granulocytes: 0.04 10*3/uL (ref 0.00–0.07)
Basophils Absolute: 0 10*3/uL (ref 0.0–0.1)
Basophils Relative: 0 %
Eosinophils Absolute: 0.1 10*3/uL (ref 0.0–0.5)
Eosinophils Relative: 2 %
HCT: 42.3 % (ref 39.0–52.0)
Hemoglobin: 14.2 g/dL (ref 13.0–17.0)
Immature Granulocytes: 0 %
Lymphocytes Relative: 33 %
Lymphs Abs: 2.9 10*3/uL (ref 0.7–4.0)
MCH: 29.9 pg (ref 26.0–34.0)
MCHC: 33.6 g/dL (ref 30.0–36.0)
MCV: 89.1 fL (ref 80.0–100.0)
Monocytes Absolute: 0.6 10*3/uL (ref 0.1–1.0)
Monocytes Relative: 7 %
Neutro Abs: 5.3 10*3/uL (ref 1.7–7.7)
Neutrophils Relative %: 58 %
Platelets: 232 10*3/uL (ref 150–400)
RBC: 4.75 MIL/uL (ref 4.22–5.81)
RDW: 13.6 % (ref 11.5–15.5)
WBC: 9.1 10*3/uL (ref 4.0–10.5)
nRBC: 0 % (ref 0.0–0.2)

## 2021-11-21 LAB — URINALYSIS, ROUTINE W REFLEX MICROSCOPIC
Bilirubin Urine: NEGATIVE
Glucose, UA: NEGATIVE mg/dL
Hgb urine dipstick: NEGATIVE
Ketones, ur: NEGATIVE mg/dL
Leukocytes,Ua: NEGATIVE
Nitrite: NEGATIVE
Protein, ur: NEGATIVE mg/dL
Specific Gravity, Urine: 1.014 (ref 1.005–1.030)
pH: 7 (ref 5.0–8.0)

## 2021-11-21 LAB — TROPONIN I (HIGH SENSITIVITY): Troponin I (High Sensitivity): 3 ng/L (ref <18)

## 2021-11-21 NOTE — ED Provider Notes (Signed)
[facility] EMERGENCY DEPARTMENT Provider Note   CSN: [id number] Arrival date & time: 11/21/21  0754     History Chief Complaint  Patient presents with   Chest Pain    Tristan Schaefer is a 50 y.o. male presented emerged part left-sided chest pain.  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  He describes a sharp pain in his left lower chest wall which does not radiate anywhere, he has never had it before.  The symptoms seem to come and go over the weekend, worse when he moves a certain way, such as bending over.  When he is at rest he has minimal or no symptoms.  Currently has no symptoms.  He denies any other associated symptoms, including headache, fevers, chills, cough, congestion, dysuria, hematuria.  He denies any history of cardiac disease.  He does not routinely go to the doctor's visit unaware of any medical issues aside from "borderline high blood pressure".  He does smoke black and milds daily.  He denies history of kidney stones.  HPI     Past Medical History:  Diagnosis Date   Hypertension     There are no problems to display for this patient.   History reviewed. No pertinent surgical history.     No family history on file.  Social History   Tobacco Use   Smoking status: Every Day    Types: Cigarettes   Smokeless tobacco: Current  Vaping Use   Vaping Use: Never used  Substance Use Topics   Alcohol use: Not Currently    Comment: occ   Drug use: Not Currently    Types: Marijuana    Home Medications Prior to Admission medications   Medication Sig Start Date End Date Taking? Authorizing Provider  ibuprofen (ADVIL,MOTRIN) 600 MG tablet Take 1 tablet (600 mg total) by mouth every 6 (six) hours as needed. Patient not taking: Reported on 11/21/2021 10/08/18   Bethel Born, PA-C  naproxen (NAPROSYN) 500 MG tablet Take 1 tablet (500 mg total) by mouth 2 (two)  times daily. Patient not taking: Reported on 11/21/2021 12/11/17   Horton, Mayer Masker, MD    Allergies    Shellfish allergy  Review of Systems   Review of Systems  Constitutional:  Negative for chills and fever.  HENT:  Negative for ear pain and sore throat.   Eyes:  Negative for pain and visual disturbance.  Respiratory:  Negative for cough and shortness of breath.   Cardiovascular:  Positive for chest pain. Negative for palpitations.  Gastrointestinal:  Negative for abdominal pain and vomiting.  Genitourinary:  Negative for dysuria and hematuria.  Musculoskeletal:  Positive for myalgias. Negative for arthralgias.  Skin:  Negative for color change and rash.  Neurological:  Negative for syncope and light-headedness.  All other systems reviewed and are negative.  Physical Exam Updated Vital Signs BP 115/77    Pulse 77    Temp 97.9 F (36.6 C) (Oral)    Resp 15    Ht 6\' 1"  (1.854 m)    Wt 99.8 kg    SpO2 100%    BMI 29.03 kg/m   Physical Exam Constitutional:      General: He is not in acute distress. HENT:     Head: Normocephalic and atraumatic.  Eyes:     Conjunctiva/sclera: Conjunctivae normal.     Pupils: Pupils are equal, round, and  reactive to light.  Cardiovascular:     Rate and Rhythm: Normal rate and regular rhythm.  Pulmonary:     Effort: Pulmonary effort is normal. No respiratory distress.  Abdominal:     General: There is no distension.     Tenderness: There is no abdominal tenderness.  Musculoskeletal:        General: Normal range of motion.     Comments: Left lower chest wall tenderness (no focal ttp), mild, worse with bending forward No CVA ttp or spinal tenderness  Skin:    General: Skin is warm and dry.  Neurological:     General: No focal deficit present.     Mental Status: He is alert. Mental status is at baseline.  Psychiatric:        Mood and Affect: Mood normal.        Behavior: Behavior normal.    ED Results / Procedures / Treatments    Labs (all labs ordered are listed, but only abnormal results are displayed) Labs Reviewed  BASIC METABOLIC PANEL - Abnormal; Notable for the following components:      Result Value   Glucose, Bld 100 (*)    All other components within normal limits  CBC WITH DIFFERENTIAL/PLATELET  URINALYSIS, ROUTINE W REFLEX MICROSCOPIC  TROPONIN I (HIGH SENSITIVITY)    EKG EKG Interpretation  Date/Time:  Monday November 21 2021 08:12:47 EST Ventricular Rate:  59 PR Interval:  145 QRS Duration: 95 QT Interval:  411 QTC Calculation: 408 R Axis:   89 Text Interpretation: Sinus rhythm Confirmed by Octaviano Glow [phone]) on 11/21/2021 8:48:08 AM  Radiology DG Chest 2 View  Result Date: 11/21/2021 CLINICAL DATA:  Chest pain, evaluate for pneumothorax or pneumonia. EXAM: CHEST - 2 VIEW COMPARISON:  None. FINDINGS: The heart size and mediastinal contours are within normal limits. Both lungs are clear. The visualized skeletal structures are unremarkable. IMPRESSION: No active cardiopulmonary disease. Electronically Signed   By: Keane Police D.O.   On: 11/21/2021 09:43    Procedures Procedures   Medications Ordered in ED Medications - No data to display  ED Course  I have reviewed the triage vital signs and the nursing notes.  Pertinent labs & imaging results that were available during my care of the patient were reviewed by me and considered in my medical decision making (see chart for details).  This patient presents to the Emergency Department with complaint of chest pain. This involves an extensive number of treatment options, and is a complaint that carries with it a high risk of complications and morbidity.  The differential diagnosis includes ACS vs Pneumothorax vs Reflux/Gastritis vs MSK pain vs Pneumonia vs other.  I felt PE was less likely given that he has no hypoxia, no tachycardia, no acute risk factors, and his symptoms are intermittent.  PERC engative.  I ordered, reviewed, and  interpreted labs, including BMP and CBC.  There were no immediate, life-threatening emergencies found in this labwork.  The patient's troponin level was 3. I ordered imaging studies which included dg chest I independently visualized and interpreted imaging which showed no focal findings to explain symptoms and the monitor tracing which showed NSR I personally reviewed the patients ECG which showed sinus rhythm (bradycardia) with no acute ischemic findings.  After the interventions stated above, I reevaluated the patient and found that they remained clinically stable.  Based on the patient's clinical exam, vital signs, risk factors, and ED testing, I felt that the patient's overall risk of life-threatening  emergency such as ACS, PE, sepsis, or infection was low.  At this time, I felt the patient's presentation was most clinically consistent with MSK pain, but explained to the patient that this evaluation was not a definitive diagnostic workup.  I discussed outpatient follow up with primary care provider, and provided specialist office number on the patient's discharge paper if a referral was deemed necessary.  Return precautions were discussed with the patient.  I felt the patient was clinically stable for discharge.   Clinical Course as of 11/21/21 1717  Mon Nov 21, 2021  1001 Trop 3 with >3 days of symptoms - unlikely ACS [MT]    Clinical Course User Index [MT] Deniah Saia, Carola Rhine, MD     Final Clinical Impression(s) / ED Diagnoses Final diagnoses:  Chest wall pain    Rx / DC Orders ED Discharge Orders     None        Wyvonnia Dusky, MD 11/21/21 [phone]

## 2021-11-21 NOTE — ED Triage Notes (Signed)
Pt c/o left sided chest pain since Friday. Denies SOB, n/v, dizziness, sweating. Reports the pain is worse when he bends over.

## 2022-04-20 ENCOUNTER — Emergency Department (HOSPITAL_COMMUNITY): Payer: Self-pay

## 2022-04-20 ENCOUNTER — Other Ambulatory Visit: Payer: Self-pay

## 2022-04-20 ENCOUNTER — Encounter (HOSPITAL_COMMUNITY): Payer: Self-pay

## 2022-04-20 ENCOUNTER — Emergency Department (HOSPITAL_COMMUNITY)
Admission: EM | Admit: 2022-04-20 | Discharge: 2022-04-20 | Disposition: A | Discharge status: Home / Self Care | Payer: Self-pay | Attending: Student | Admitting: Student

## 2022-04-20 DIAGNOSIS — B349 Viral infection, unspecified: Secondary | ICD-10-CM | POA: Insufficient documentation

## 2022-04-20 DIAGNOSIS — Z20822 Contact with and (suspected) exposure to covid-19: Secondary | ICD-10-CM | POA: Insufficient documentation

## 2022-04-20 LAB — RESP PANEL BY RT-PCR (FLU A&B, COVID) ARPGX2
Influenza A by PCR: NEGATIVE
Influenza B by PCR: NEGATIVE
SARS Coronavirus 2 by RT PCR: NEGATIVE

## 2022-04-20 NOTE — ED Provider Notes (Signed)
[facility] EMERGENCY DEPARTMENT Provider Note   CSN: [id number] Arrival date & time: 04/20/22  1511     History  Chief Complaint  Patient presents with   Generalized Body Aches    Tristan Schaefer is a 51 y.o. male.  Pt complains of body aches and cough.  Pt reports he has felt bad for the past 5 days.  Pt unsure if he has had a fever. Pt denies sore throat or ear ache.   The history is provided by the patient. No language interpreter was used.  Cough Cough characteristics:  Non-productive Sputum characteristics:  Nondescript Severity:  Moderate Onset quality:  Gradual Duration:  5 days Timing:  Constant Progression:  Worsening Chronicity:  New Smoker: yes   Context: upper respiratory infection   Context: not exposure to allergens and not sick contacts   Relieved by:  Nothing Worsened by:  Nothing Ineffective treatments:  None tried Associated symptoms: no chest pain and no chills   Risk factors: no recent infection       Home Medications Prior to Admission medications   Medication Sig Start Date End Date Taking? Authorizing Provider  ibuprofen (ADVIL,MOTRIN) 600 MG tablet Take 1 tablet (600 mg total) by mouth every 6 (six) hours as needed. Patient not taking: Reported on 11/21/2021 10/08/18   Bethel Born, PA-C  naproxen (NAPROSYN) 500 MG tablet Take 1 tablet (500 mg total) by mouth 2 (two) times daily. Patient not taking: Reported on 11/21/2021 12/11/17   Horton, Mayer Masker, MD      Allergies    Shellfish allergy    Review of Systems   Review of Systems  Constitutional:  Negative for chills.  Respiratory:  Positive for cough.   Cardiovascular:  Negative for chest pain.  All other systems reviewed and are negative.  Physical Exam Updated Vital Signs BP 134/73   Pulse 66   Temp 98.5 F (36.9 C) (Oral)   Resp 16   SpO2 97%  Physical Exam Vitals and nursing note reviewed.  Constitutional:      General: He is not in acute distress.     Appearance: He is well-developed.  HENT:     Head: Normocephalic and atraumatic.     Nose: Nose normal.     Mouth/Throat:     Mouth: Mucous membranes are moist.  Eyes:     Conjunctiva/sclera: Conjunctivae normal.  Cardiovascular:     Rate and Rhythm: Normal rate and regular rhythm.     Heart sounds: No murmur heard. Pulmonary:     Effort: Pulmonary effort is normal. No respiratory distress.     Breath sounds: Rhonchi present.  Musculoskeletal:        General: No swelling.     Cervical back: Neck supple.  Skin:    General: Skin is warm and dry.     Capillary Refill: Capillary refill takes less than 2 seconds.  Neurological:     Mental Status: He is alert.  Psychiatric:        Mood and Affect: Mood normal.    ED Results / Procedures / Treatments   Labs (all labs ordered are listed, but only abnormal results are displayed) Labs Reviewed  RESP PANEL BY RT-PCR (FLU A&B, COVID) [id number]    EKG None  Radiology DG Chest 2 View  Result Date: 04/20/2022 CLINICAL DATA:  Cough EXAM: CHEST - 2 VIEW COMPARISON:  11/21/2021 FINDINGS: Cardiac and mediastinal contours are within normal limits. No focal pulmonary opacity. No pleural effusion or  pneumothorax. No acute osseous abnormality. IMPRESSION: No acute cardiopulmonary process. Electronically Signed   By: Merilyn Baba M.D.   On: 04/20/2022 17:26    Procedures Procedures    Medications Ordered in ED Medications - No data to display  ED Course/ Medical Decision Making/ A&P                           Medical Decision Making Problems Addressed: Viral illness: acute illness or injury    Details: Pt complains of bodyaches and cough  Amount and/or Complexity of Data Reviewed Labs: ordered. Decision-making details documented in ED Course.    Details: Influenza and covid are negative Radiology: ordered and independent interpretation performed. Decision-making details documented in ED Course.    Details: Chest xray is  normal.           Final Clinical Impression(s) / ED Diagnoses Final diagnoses:  Viral illness    Rx / DC Orders ED Discharge Orders     None      An After Visit Summary was printed and given to the patient.    Fransico Meadow, [place] 04/20/22 1950    Sherwood Gambler, MD 04/21/22 1555

## 2022-04-20 NOTE — ED Triage Notes (Signed)
Reports body aches with cough x 3 days.  Unknown fever.  Resp even and unlabored.  Skin warm and dry.  nad

## 2022-04-20 NOTE — Discharge Instructions (Signed)
Follow up with your Physician if symptoms persist.  Tylenol every 4 hours

## 2022-05-31 ENCOUNTER — Encounter (HOSPITAL_COMMUNITY): Payer: Self-pay | Admitting: *Deleted

## 2022-05-31 ENCOUNTER — Other Ambulatory Visit: Payer: Self-pay

## 2022-05-31 ENCOUNTER — Emergency Department (HOSPITAL_COMMUNITY)
Admission: EM | Admit: 2022-05-31 | Discharge: 2022-05-31 | Disposition: A | Discharge status: Home / Self Care | Payer: Self-pay | Attending: Emergency Medicine | Admitting: Emergency Medicine

## 2022-05-31 DIAGNOSIS — L84 Corns and callosities: Secondary | ICD-10-CM | POA: Insufficient documentation

## 2022-05-31 MED ORDER — MELOXICAM 7.5 MG PO TABS: 7.5000 mg | ORAL_TABLET | Freq: Two times a day (BID) | ORAL | 0 refills | Status: AC | PRN

## 2022-05-31 NOTE — Discharge Instructions (Signed)
Please take Mobic, 7.5 mg by mouth twice daily as needed for pain - this in an antiinflammatory medicine (NSAID) and is similar to ibuprofen - many people feel that it is stronger than ibuprofen and it is easier to take since it is a smaller pill.  Please use this only for 1 week - if your pain persists, you will need to follow up with your doctor in the office for ongoing guidance and pain control.  Thank you for allowing [place] to treat you in the emergency department today.  After reviewing your examination and potential testing that was done it appears that you are safe to go home.  I would like for you to follow-up with your doctor within the next several days, have them obtain your results and follow-up with them to review all of these tests.  If you should develop severe or worsening symptoms return to the emergency department immediately

## 2022-05-31 NOTE — ED Triage Notes (Signed)
Pt with bilateral feet pain for awhile, pt states he has flat feet.

## 2022-05-31 NOTE — ED Provider Notes (Signed)
  [facility] EMERGENCY DEPARTMENT Provider Note   CSN: [id number] Arrival date & time: 05/31/22  1858     History  No chief complaint on file.   Tristan Schaefer is a 51 y.o. male.  HPI  This patient has corns on the bottom of both of his feet, they cause pain, he has never seen a podiatrist, no other complaints, he has pes planus  Home Medications Prior to Admission medications   Medication Sig Start Date End Date Taking? Authorizing Provider  meloxicam (MOBIC) 7.5 MG tablet Take 1 tablet (7.5 mg total) by mouth 2 (two) times daily as needed for up to 14 days for pain. 05/31/22 06/14/22 Yes Eber Hong, MD      Allergies    Shellfish allergy    Review of Systems   Review of Systems  Skin:  Negative for rash.    Physical Exam Updated Vital Signs BP 133/68 (BP Location: Right Arm)   Pulse (!) 56   Temp 99.6 F (37.6 C) (Oral)   Resp 16   Ht 1.854 m (6\' 1" )   Wt 93 kg   SpO2 100%   BMI 27.05 kg/m  Physical Exam Vitals and nursing note reviewed.  Constitutional:      Appearance: He is well-developed. He is not diaphoretic.  HENT:     Head: Normocephalic and atraumatic.  Eyes:     General:        Right eye: No discharge.        Left eye: No discharge.     Conjunctiva/sclera: Conjunctivae normal.  Pulmonary:     Effort: Pulmonary effort is normal. No respiratory distress.  Musculoskeletal:     Comments: Corns on the bottom of both of his feet, no other abnormalities, no tenderness in the arch, no swelling of the feet  Skin:    General: Skin is warm and dry.     Findings: No erythema or rash.  Neurological:     Mental Status: He is alert.     Coordination: Coordination normal.     ED Results / Procedures / Treatments   Labs (all labs ordered are listed, but only abnormal results are displayed) Labs Reviewed - No data to display  EKG None  Radiology No results found.  Procedures Procedures    Medications Ordered in ED Medications - No data  to display  ED Course/ Medical Decision Making/ A&P                           Medical Decision Making Risk Prescription drug management.   Referred to podiatry, patient does not need any acute intervention        Final Clinical Impression(s) / ED Diagnoses Final diagnoses:  Corn of foot    Rx / DC Orders ED Discharge Orders          Ordered    meloxicam (MOBIC) 7.5 MG tablet  2 times daily PRN        05/31/22 2042              2043, MD 05/31/22 2043

## 2022-11-14 ENCOUNTER — Emergency Department (HOSPITAL_COMMUNITY)
Admission: EM | Admit: 2022-11-14 | Discharge: 2022-11-14 | Disposition: A | Discharge status: Home / Self Care | Payer: Self-pay | Attending: Emergency Medicine | Admitting: Emergency Medicine

## 2022-11-14 ENCOUNTER — Encounter (HOSPITAL_COMMUNITY): Payer: Self-pay

## 2022-11-14 ENCOUNTER — Other Ambulatory Visit: Payer: Self-pay

## 2022-11-14 DIAGNOSIS — K047 Periapical abscess without sinus: Secondary | ICD-10-CM | POA: Insufficient documentation

## 2022-11-14 DIAGNOSIS — I10 Essential (primary) hypertension: Secondary | ICD-10-CM | POA: Insufficient documentation

## 2022-11-14 MED ORDER — AMOXICILLIN-POT CLAVULANATE 875-125 MG PO TABS: 1.0000 | ORAL_TABLET | Freq: Two times a day (BID) | ORAL | 0 refills | Status: DC

## 2022-11-14 MED ORDER — OXYCODONE-ACETAMINOPHEN 5-325 MG PO TABS: 1.0000 | ORAL_TABLET | Freq: Four times a day (QID) | ORAL | 0 refills | Status: DC | PRN

## 2022-11-14 NOTE — Discharge Instructions (Signed)
Take antibiotics and pain medicine.  Follow-up with your dentist as needed and if you cannot get in with them or dentist on-call as listed.

## 2022-11-14 NOTE — ED Triage Notes (Signed)
Pt presents to ED with complaints of right lower facial swelling since last night. Pt unable to get in with dentist until Feb.

## 2022-11-14 NOTE — ED Provider Notes (Signed)
[facility] EMERGENCY DEPARTMENT Provider Note   CSN: [id number] Arrival date & time: 11/14/22  1528     History  Chief Complaint  Patient presents with   Oral Swelling    Tristan Schaefer is a 51 y.o. male.  HPI Patient presents with right mandibular pain and swelling.  Began last night.  Has a known broken tooth in the area.  Now more painful.  Cannot get in with dentist until February.  No fevers.  States does have some pain in the area.  No difficulty swallowing.  States it did burst from the waiting room but.   Past Medical History:  Diagnosis Date   Hypertension     Home Medications Prior to Admission medications   Medication Sig Start Date End Date Taking? Authorizing Provider  amoxicillin-clavulanate (AUGMENTIN) 875-125 MG tablet Take 1 tablet by mouth every 12 (twelve) hours. 11/14/22  Yes Benjiman Core, MD  oxyCODONE-acetaminophen (PERCOCET/ROXICET) 5-325 MG tablet Take 1 tablet by mouth every 6 (six) hours as needed for severe pain. 11/14/22  Yes Benjiman Core, MD      Allergies    Shellfish allergy    Review of Systems   Review of Systems  Physical Exam Updated Vital Signs BP (!) 159/86 (BP Location: Right Arm)   Pulse 85   Temp 98.5 F (36.9 C) (Oral)   Resp 20   Ht 6\' 1"  (1.854 m)   Wt 90.7 kg   SpO2 98%   BMI 26.39 kg/m  Physical Exam Vitals and nursing note reviewed.  Cardiovascular:     Rate and Rhythm: Regular rhythm.  Musculoskeletal:     Cervical back: Neck supple. No tenderness.  Neurological:     Mental Status: He is alert.   Swelling of the right side of the mandible.  Right 6-3 midline mandible broken off.  Some swelling laterally.  No medial swelling.  No swelling of floor mouth.    ED Results / Procedures / Treatments   Labs (all labs ordered are listed, but only abnormal results are displayed) Labs Reviewed - No data to display  EKG None  Radiology No results found.  Procedures Procedures    Medications  Ordered in ED Medications - No data to display  ED Course/ Medical Decision Making/ A&P                           Medical Decision Making Risk Prescription drug management.   Patient with apparent dental abscess/infection.  No airway involvement.  No swelling to floor of mouth.  Has a dentist but cannot get in urgently.  Will give antibiotics and pain meds.  Also given name of dentist on-call in terms of follow-up if he cannot get in with his doctor.  Does not appear to require additional hospital.  Does not have clear drainable abscess at this time.  Does not appear to require imaging.       Final Clinical Impression(s) / ED Diagnoses Final diagnoses:  Dental abscess    Rx / DC Orders ED Discharge Orders          Ordered    oxyCODONE-acetaminophen (PERCOCET/ROXICET) 5-325 MG tablet  Every 6 hours PRN        11/14/22 1844    amoxicillin-clavulanate (AUGMENTIN) 875-125 MG tablet  Every 12 hours        11/14/22 1844              14/12/23, MD 11/14/22 1850

## 2022-12-08 ENCOUNTER — Encounter (HOSPITAL_COMMUNITY): Payer: Self-pay | Admitting: Emergency Medicine

## 2022-12-08 ENCOUNTER — Emergency Department (HOSPITAL_COMMUNITY)
Admission: EM | Admit: 2022-12-08 | Discharge: 2022-12-08 | Disposition: A | Discharge status: Home / Self Care | Payer: No Typology Code available for payment source | Attending: Emergency Medicine | Admitting: Emergency Medicine

## 2022-12-08 ENCOUNTER — Other Ambulatory Visit: Payer: Self-pay

## 2022-12-08 ENCOUNTER — Emergency Department (HOSPITAL_COMMUNITY): Payer: No Typology Code available for payment source

## 2022-12-08 DIAGNOSIS — I10 Essential (primary) hypertension: Secondary | ICD-10-CM | POA: Insufficient documentation

## 2022-12-08 DIAGNOSIS — M542 Cervicalgia: Secondary | ICD-10-CM | POA: Diagnosis present

## 2022-12-08 DIAGNOSIS — M25512 Pain in left shoulder: Secondary | ICD-10-CM | POA: Diagnosis not present

## 2022-12-08 DIAGNOSIS — Y92410 Unspecified street and highway as the place of occurrence of the external cause: Secondary | ICD-10-CM | POA: Insufficient documentation

## 2022-12-08 DIAGNOSIS — S161XXA Strain of muscle, fascia and tendon at neck level, initial encounter: Secondary | ICD-10-CM

## 2022-12-08 MED ORDER — METHOCARBAMOL 500 MG PO TABS
500.0000 mg | ORAL_TABLET | Freq: Once | ORAL | Status: AC
  Administered 2022-12-08: 500 mg via ORAL
  Filled 2022-12-08: qty 1

## 2022-12-08 MED ORDER — METHOCARBAMOL 500 MG PO TABS: 500.0000 mg | ORAL_TABLET | Freq: Three times a day (TID) | ORAL | 0 refills | Status: DC

## 2022-12-08 MED ORDER — NAPROXEN 500 MG PO TABS: 500.0000 mg | ORAL_TABLET | Freq: Two times a day (BID) | ORAL | 0 refills | Status: DC

## 2022-12-08 MED ORDER — OXYCODONE-ACETAMINOPHEN 5-325 MG PO TABS
1.0000 | ORAL_TABLET | Freq: Once | ORAL | Status: AC
  Administered 2022-12-08: 1 via ORAL
  Filled 2022-12-08: qty 1

## 2022-12-08 NOTE — Discharge Instructions (Signed)
As discussed, I recommend that you alternate ice and heat to your neck and shoulder area.  Avoid heavy lifting for 1 week.  Take the medication as directed.  Please follow-up with your primary care provider in 1 week for recheck.  Return emergency department for any new or worsening symptoms.
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
## 2022-12-22 ENCOUNTER — Emergency Department
Admission: EM | Admit: 2022-12-22 | Discharge: 2022-12-22 | Disposition: A | Discharge status: Home / Self Care | Payer: No Typology Code available for payment source | Attending: Emergency Medicine | Admitting: Emergency Medicine

## 2022-12-22 ENCOUNTER — Emergency Department: Payer: No Typology Code available for payment source

## 2022-12-22 DIAGNOSIS — Y92410 Unspecified street and highway as the place of occurrence of the external cause: Secondary | ICD-10-CM | POA: Diagnosis not present

## 2022-12-22 DIAGNOSIS — I10 Essential (primary) hypertension: Secondary | ICD-10-CM | POA: Insufficient documentation

## 2022-12-22 DIAGNOSIS — S6992XA Unspecified injury of left wrist, hand and finger(s), initial encounter: Secondary | ICD-10-CM | POA: Insufficient documentation

## 2022-12-22 MED ORDER — MELOXICAM 7.5 MG PO TABS
15.0000 mg | ORAL_TABLET | Freq: Once | ORAL | Status: AC
  Administered 2022-12-22: 15 mg via ORAL
  Filled 2022-12-22: qty 2

## 2022-12-22 MED ORDER — MELOXICAM 15 MG PO TABS: 15.0000 mg | ORAL_TABLET | Freq: Every day | ORAL | 2 refills | Status: AC
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
## 2022-12-22 NOTE — ED Triage Notes (Signed)
Pt sts that for the last four days he has been feeling like something in the middle of his hand is pinching him.

## 2022-12-22 NOTE — Discharge Instructions (Signed)
Wear the splint at all times except when showering.  Call and schedule a follow up with the orthopedic specialist.  Take the Meloxicam daily. Do not take any additional NSAIDs like ibuprofen or Aleve.

## 2023-10-07 IMAGING — DX DG CHEST 2V
2 series · 2 of 2 positions shown · non-contrast
Comparison: None.

CLINICAL DATA: Chest pain, evaluate for pneumothorax or pneumonia.

EXAM:
CHEST - 2 VIEW

[chest pa]
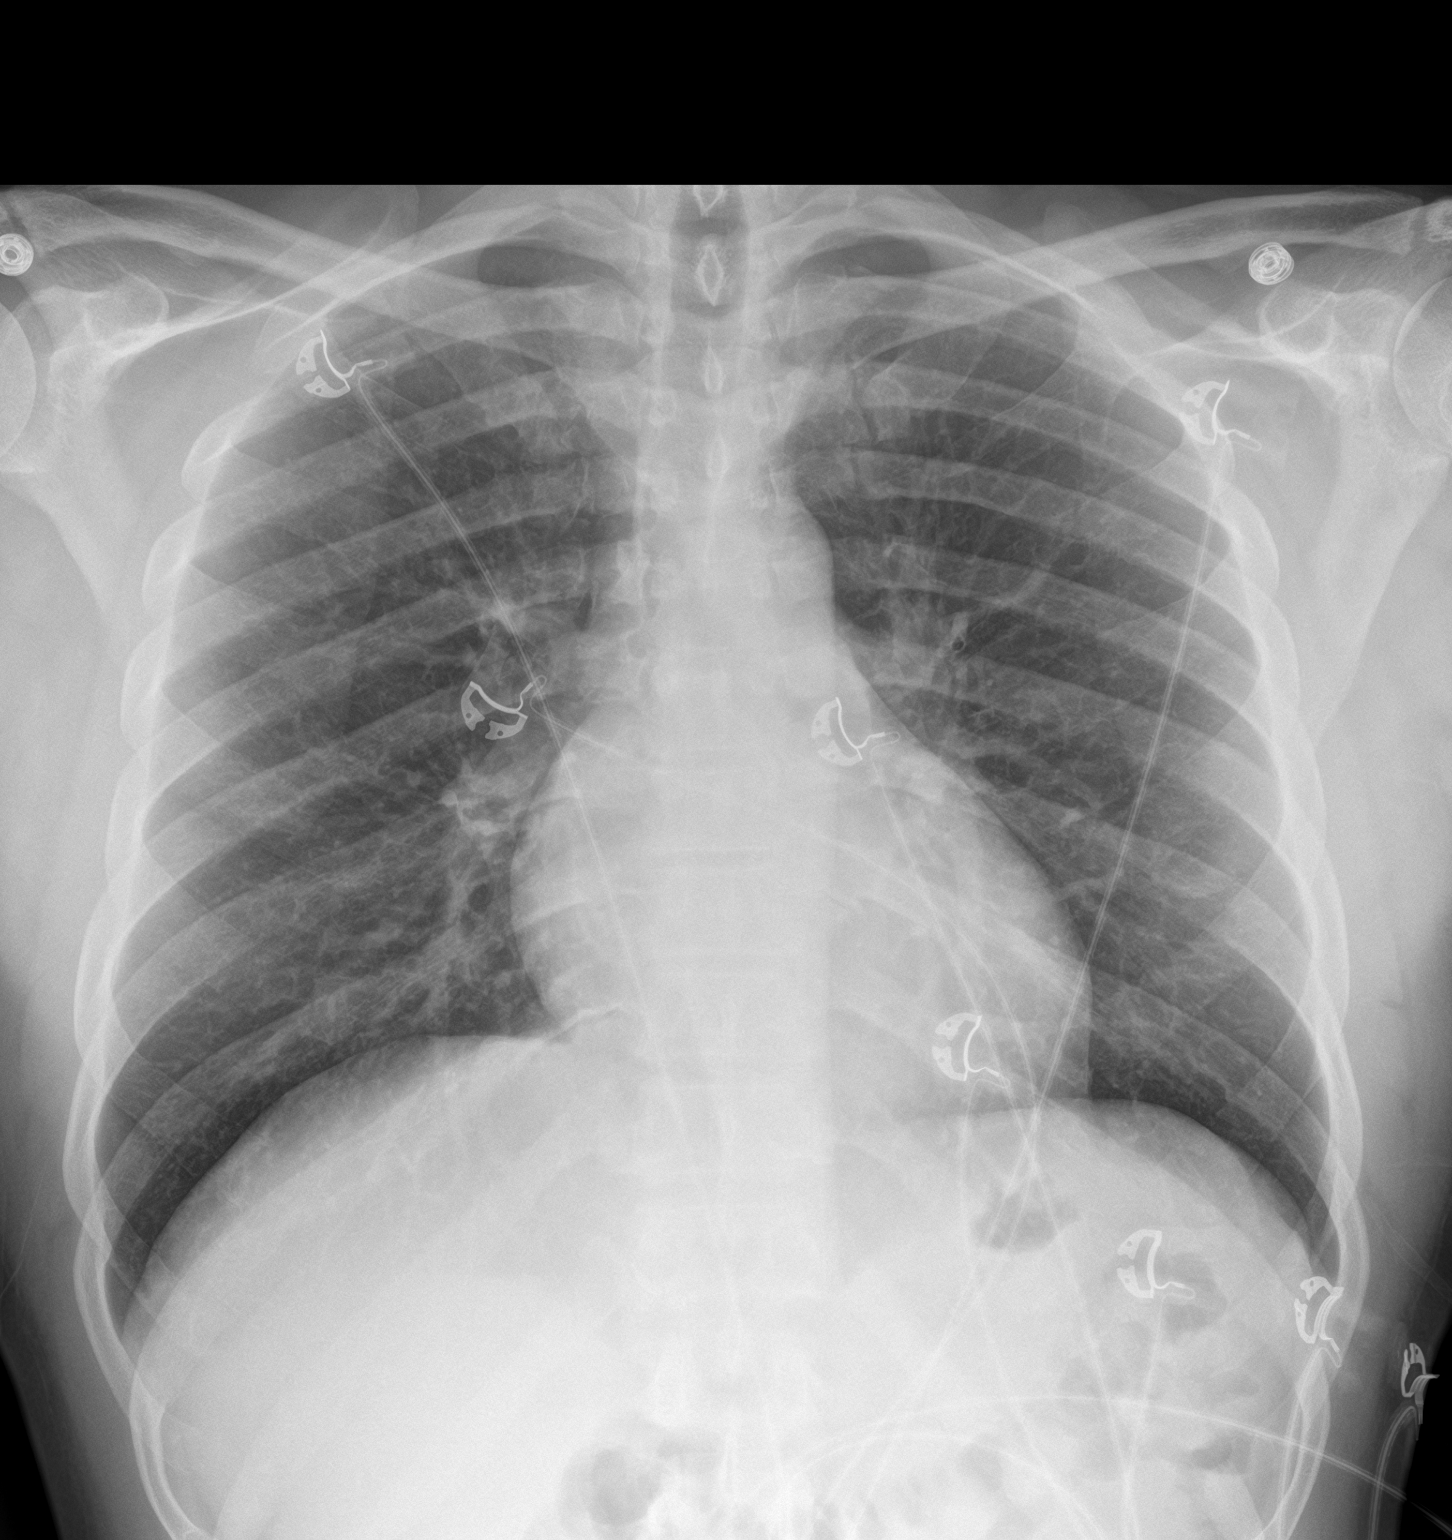

[chest lat]
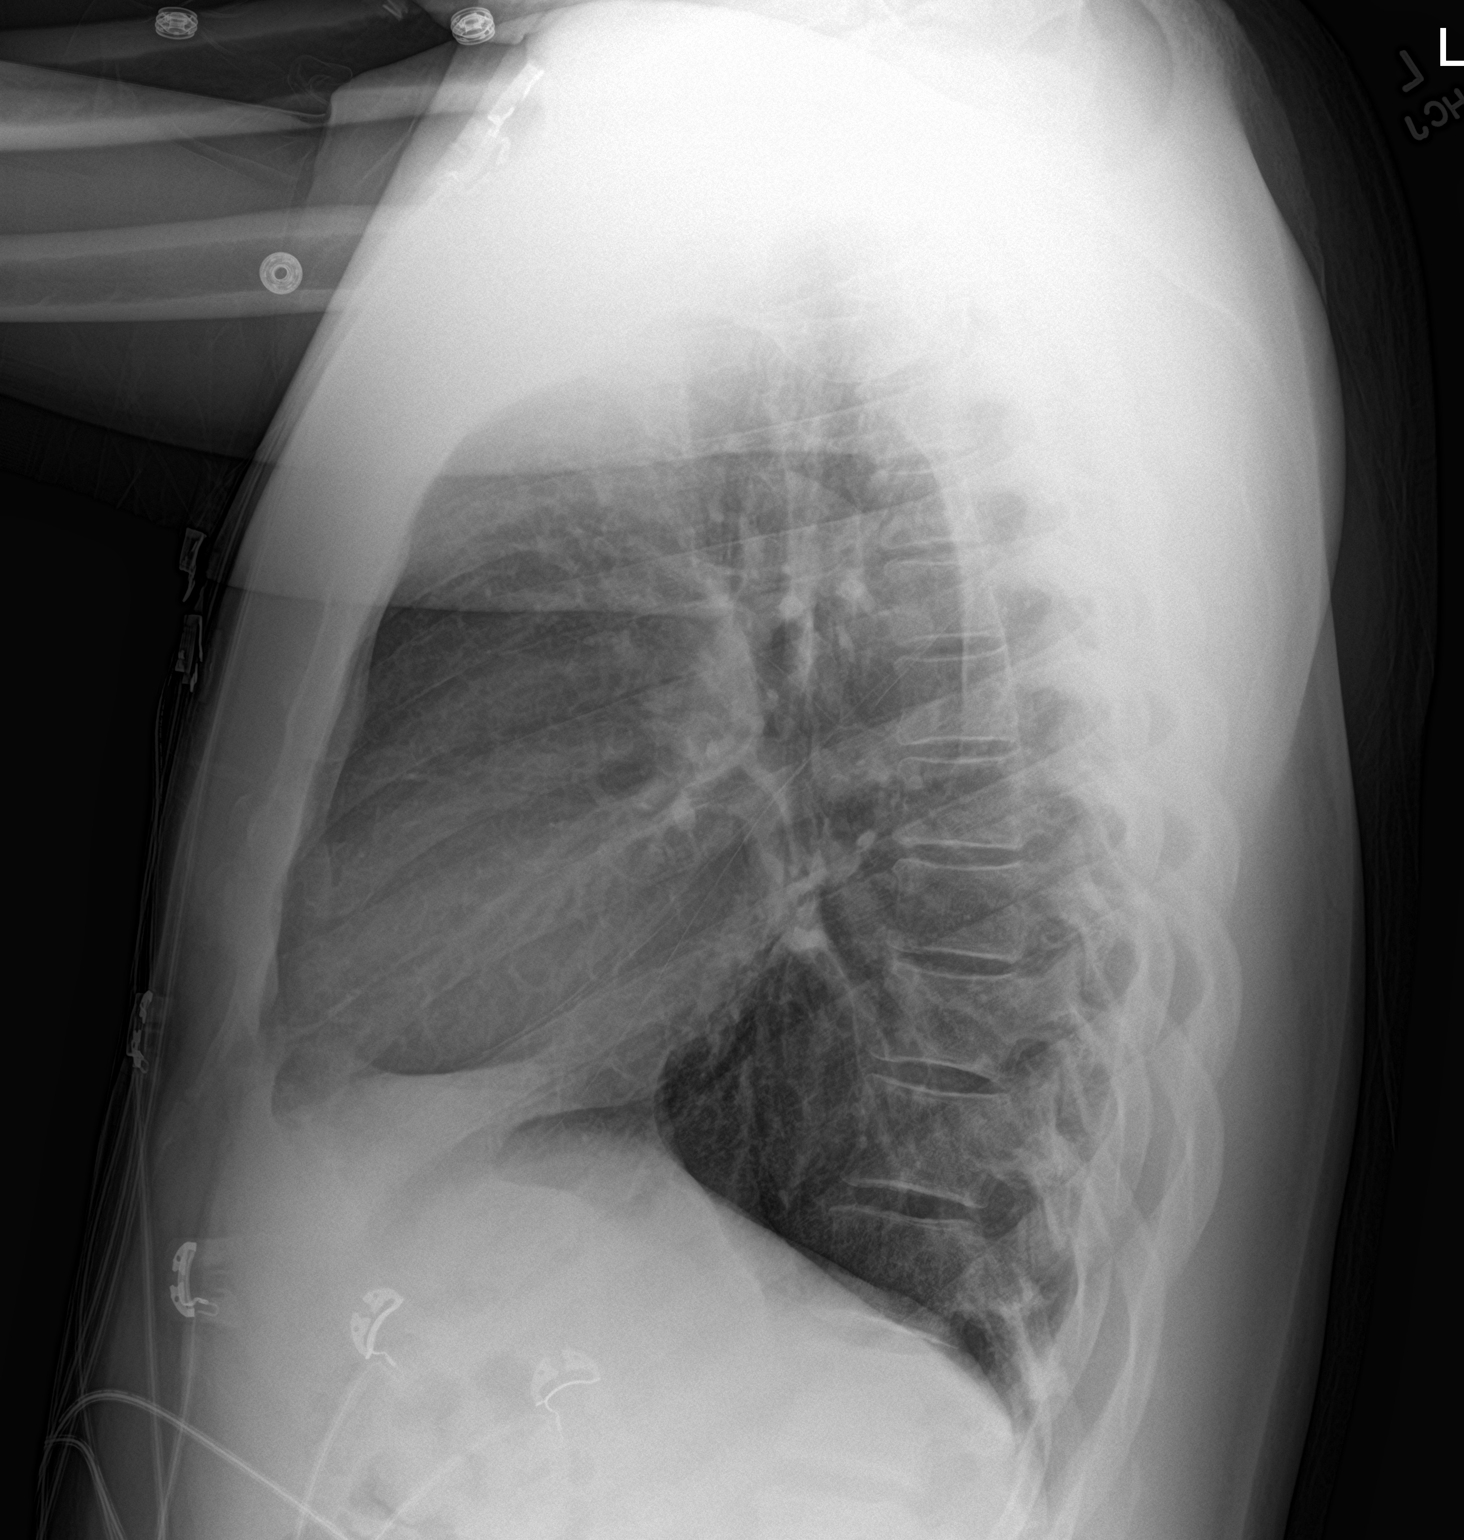

[2 of 2 positions shown; findings below may reference images not displayed]

FINDINGS: The heart size and mediastinal contours are within normal limits.
Both lungs are clear. The visualized skeletal structures are
unremarkable.
IMPRESSION: No active cardiopulmonary disease.

## 2023-10-15 ENCOUNTER — Emergency Department (HOSPITAL_COMMUNITY)
Admission: EM | Admit: 2023-10-15 | Discharge: 2023-10-15 | Disposition: A | Discharge status: Home / Self Care | Payer: 59 | Attending: Emergency Medicine | Admitting: Emergency Medicine

## 2023-10-15 ENCOUNTER — Encounter (HOSPITAL_COMMUNITY): Payer: Self-pay

## 2023-10-15 ENCOUNTER — Other Ambulatory Visit: Payer: Self-pay

## 2023-10-15 DIAGNOSIS — I10 Essential (primary) hypertension: Secondary | ICD-10-CM | POA: Diagnosis not present

## 2023-10-15 DIAGNOSIS — K047 Periapical abscess without sinus: Secondary | ICD-10-CM | POA: Insufficient documentation

## 2023-10-15 DIAGNOSIS — R220 Localized swelling, mass and lump, head: Secondary | ICD-10-CM | POA: Diagnosis present

## 2023-10-15 MED ORDER — HYDROCODONE-ACETAMINOPHEN 5-325 MG PO TABS: 1.0000 | ORAL_TABLET | Freq: Four times a day (QID) | ORAL | 0 refills | Status: DC | PRN

## 2023-10-15 MED ORDER — AMOXICILLIN 500 MG PO CAPS: 500.0000 mg | ORAL_CAPSULE | Freq: Three times a day (TID) | ORAL | 0 refills | Status: DC

## 2023-10-15 NOTE — ED Triage Notes (Signed)
Pt to er, pt states that he has a broken tooth and now he is having swelling to his R lower jaw, states that he has a dentist, but he doesn't have insurance so he is here in the er, pt states that he has had the swelling for the past two days.

## 2023-10-15 NOTE — ED Provider Notes (Signed)
[place] EMERGENCY DEPARTMENT AT [facility] Provider Note   CSN: [id number] Arrival date & time: 10/15/23  1357     History  Chief Complaint  Patient presents with   Oral Swelling    Tristan Schaefer is a 52 y.o. male  presentings with a 2 day history of dental pain and gingival swelling.   The patient has a history of injury and/or decay in the tooth involved which has recently started to cause increased  pain. He states the tooth had significant decay for a while, then about a month ago it partially broke. There has been no fevers, chills, nausea or vomiting, also no complaint of difficulty swallowing, although chewing makes pain worse.  The patient has tried ibuprofen with some relief of symptoms.     The history is provided by the patient.       Home Medications Prior to Admission medications   Medication Sig Start Date End Date Taking? Authorizing Provider  amoxicillin (AMOXIL) 500 MG capsule Take 1 capsule (500 mg total) by mouth 3 (three) times daily. 10/15/23  Yes Bird Tailor, Raynelle Fanning, PA-C  HYDROcodone-acetaminophen (NORCO/VICODIN) 5-325 MG tablet Take 1 tablet by mouth every 6 (six) hours as needed for severe pain (pain score 7-10). 10/15/23  Yes Ota Ebersole, Raynelle Fanning, PA-C  amoxicillin-clavulanate (AUGMENTIN) 875-125 MG tablet Take 1 tablet by mouth every 12 (twelve) hours. 11/14/22   Benjiman Core, MD  meloxicam (MOBIC) 15 MG tablet Take 1 tablet (15 mg total) by mouth daily. 12/22/22 12/22/23  Triplett, Kasandra Knudsen, FNP  methocarbamol (ROBAXIN) 500 MG tablet Take 1 tablet (500 mg total) by mouth 3 (three) times daily. 12/08/22   Triplett, Tammy, PA-C  oxyCODONE-acetaminophen (PERCOCET/ROXICET) 5-325 MG tablet Take 1 tablet by mouth every 6 (six) hours as needed for severe pain. 11/14/22   Benjiman Core, MD      Allergies    Shellfish allergy    Review of Systems   Review of Systems  Constitutional:  Negative for fever.  HENT:  Positive for dental problem. Negative for  facial swelling and sore throat.   Respiratory:  Negative for shortness of breath.   Musculoskeletal:  Negative for neck pain and neck stiffness.    Physical Exam Updated Vital Signs BP (!) 196/84 (BP Location: Right Arm)   Pulse (!) 54   Temp 98.7 F (37.1 C) (Oral)   Resp 16   Ht 6\' 1"  (1.854 m)   Wt 93 kg   SpO2 100%   BMI 27.05 kg/m  Physical Exam Constitutional:      General: He is not in acute distress.    Appearance: He is well-developed.  HENT:     Head: Normocephalic and atraumatic.     Jaw: Swelling present. No trismus or pain on movement.     Comments: Soft edema noted along the right cheek along the mandible margin.  No trismus, no sublingual tenderness or induration.  No facial erythema, his first right lower molar shows significant decay with the anterior portion of the tooth fractured away.  No visualized or palpated abscess pocket.    Right Ear: Tympanic membrane and external ear normal.     Left Ear: Tympanic membrane and external ear normal.     Mouth/Throat:     Mouth: Mucous membranes are moist. No oral lesions.     Dentition: Dental abscesses present.     Pharynx: Oropharynx is clear. No posterior oropharyngeal erythema.  Eyes:     Conjunctiva/sclera: Conjunctivae normal.  Cardiovascular:  Rate and Rhythm: Normal rate.  Pulmonary:     Effort: Pulmonary effort is normal.  Musculoskeletal:        General: Normal range of motion.     Cervical back: Normal range of motion and neck supple.  Lymphadenopathy:     Cervical: No cervical adenopathy.  Skin:    General: Skin is warm and dry.     Findings: No erythema.  Neurological:     Mental Status: He is alert and oriented to person, place, and time.     ED Results / Procedures / Treatments   Labs (all labs ordered are listed, but only abnormal results are displayed) Labs Reviewed - No data to display  EKG None  Radiology No results found.  Procedures Procedures    Medications Ordered in  ED Medications - No data to display  ED Course/ Medical Decision Making/ A&P                                 Medical Decision Making Patient presenting with what appears to be a simple dental infection, soft gingival and facial edema edema without localizing abscess.  He is started on antibiotics, advised to withhold ice or heat therapy.  He has a Education officer, community, has delayed going secondary to insurance issues, he is encouraged to contact his dentist regardless as he will need this tooth addressed.  We also discussed his elevated blood pressure, he has been told in the past he has had elevated blood pressure but has never been on medication, he does not currently have a PCP.  He was given information about blood pressure and dangers of ignoring, he denies headache, chest pain shortness of breath, no peripheral edema.  He was given referrals for primary MD, in the interim I have given him a referral to [facility] for recheck of his blood pressure in 1 week at which time he could see an MD via Skype there if his blood pressure remains elevated, it is possible his pressure today is partially being driven by dental pain.  He had taken ibuprofen for his dental pain, he was asked to hold this medication as it can elevate blood pressure.  I will prescribe him of small quantity of hydrocodone in place of the ibuprofen.  Risk Prescription drug management.           Final Clinical Impression(s) / ED Diagnoses Final diagnoses:  Dental abscess  Hypertension, unspecified type    Rx / DC Orders ED Discharge Orders          Ordered    amoxicillin (AMOXIL) 500 MG capsule  3 times daily        10/15/23 1645    HYDROcodone-acetaminophen (NORCO/VICODIN) 5-325 MG tablet  Every 6 hours PRN        10/15/23 1645              Burgess Amor, PA-C 10/18/23 1635    Bethann Berkshire, MD 10/20/23 [phone]

## 2023-10-15 NOTE — Discharge Instructions (Signed)
Take the entire course of the antibiotic prescribed for your dental abscess.  Plan follow-up care with your dentist as this tooth definitely needs extraction for it will just continue to become infected and cause you further problems.  As discussed your blood pressure is significantly elevated today, possibly related to the pain from your dental abscess, however I do recommend you having a repeat blood pressure check in a week.  You may go to our [employer] clinic for this, refer to the information below regarding this clinic.  [facility]  [address] [place], [place] [address] [phone]  Services [facility] offers a variety of basic health services.  Services include but are not limited to: Blood pressure checks  Heart rate checks  Blood sugar checks  Urine analysis  Rapid strep tests  Pregnancy tests.  Health education and referrals  People needing more complex services will be directed to a physician online. Using these virtual visits, doctors can evaluate and prescribe medicine and treatments. There will be no medication on-site, though [facility] will help patients fill their prescriptions at little to no cost.   For More information please go to: [email or web address]

## 2023-12-05 DIAGNOSIS — Z419 Encounter for procedure for purposes other than remedying health state, unspecified: Secondary | ICD-10-CM | POA: Diagnosis not present

## 2024-01-18 ENCOUNTER — Encounter (HOSPITAL_COMMUNITY): Payer: Self-pay | Admitting: Emergency Medicine

## 2024-01-18 ENCOUNTER — Emergency Department (HOSPITAL_COMMUNITY)
Admission: EM | Admit: 2024-01-18 | Discharge: 2024-01-18 | Disposition: A | Discharge status: Home / Self Care | Payer: Medicaid Other | Attending: Emergency Medicine | Admitting: Emergency Medicine

## 2024-01-18 ENCOUNTER — Other Ambulatory Visit: Payer: Self-pay

## 2024-01-18 DIAGNOSIS — I10 Essential (primary) hypertension: Secondary | ICD-10-CM | POA: Diagnosis not present

## 2024-01-18 DIAGNOSIS — Z79899 Other long term (current) drug therapy: Secondary | ICD-10-CM | POA: Diagnosis not present

## 2024-01-18 DIAGNOSIS — K047 Periapical abscess without sinus: Secondary | ICD-10-CM | POA: Insufficient documentation

## 2024-01-18 DIAGNOSIS — K0889 Other specified disorders of teeth and supporting structures: Secondary | ICD-10-CM | POA: Diagnosis present

## 2024-01-18 MED ORDER — AMOXICILLIN-POT CLAVULANATE 875-125 MG PO TABS: 1.0000 | ORAL_TABLET | Freq: Two times a day (BID) | ORAL | 0 refills | Status: DC

## 2024-01-18 MED ORDER — LIDOCAINE VISCOUS HCL 2 % MT SOLN: 5.0000 mL | Freq: Three times a day (TID) | OROMUCOSAL | 0 refills | Status: DC

## 2024-01-18 MED ORDER — AMLODIPINE BESYLATE 10 MG PO TABS: 10.0000 mg | ORAL_TABLET | Freq: Every day | ORAL | 1 refills | Status: DC

## 2024-01-18 NOTE — ED Provider Notes (Signed)
[place] EMERGENCY DEPARTMENT AT [facility] Provider Note   CSN: [id number] Arrival date & time: 01/18/24  1459     History  Chief Complaint  Patient presents with   Dental Pain    Tristan Schaefer is a 53 y.o. male who presents to the ED for evaluation of 2 days of right lower dental pain.  Reports he has not been able to see a dentist in quite some time, multiple years, due to lack of insurance.  Reports he recently obtained insurance and is planning to see his PCP very soon.  He states he has a broken tooth on the right lower side and this is where he feels as if his pain is coming from.  He denies fevers, nausea or vomiting at home.  Denies facial swelling, trouble swallowing.  Of note, patient hypertensive.  Denies chest pain, shortness of breath, headache or blurred vision.   Dental Pain Associated symptoms: no facial swelling and no fever        Home Medications Prior to Admission medications   Medication Sig Start Date End Date Taking? Authorizing Provider  amLODipine (NORVASC) 10 MG tablet Take 1 tablet (10 mg total) by mouth daily. 01/18/24  Yes Al Decant, PA-C  amoxicillin-clavulanate (AUGMENTIN) 875-125 MG tablet Take 1 tablet by mouth every 12 (twelve) hours. 01/18/24  Yes Al Decant, PA-C  magic mouthwash (lidocaine, diphenhydrAMINE, alum & mag hydroxide) suspension Swish and spit 5 mLs 3 (three) times daily. 01/18/24  Yes Al Decant, PA-C  amoxicillin (AMOXIL) 500 MG capsule Take 1 capsule (500 mg total) by mouth 3 (three) times daily. 10/15/23   Burgess Amor, PA-C  HYDROcodone-acetaminophen (NORCO/VICODIN) 5-325 MG tablet Take 1 tablet by mouth every 6 (six) hours as needed for severe pain (pain score 7-10). 10/15/23   Burgess Amor, PA-C  methocarbamol (ROBAXIN) 500 MG tablet Take 1 tablet (500 mg total) by mouth 3 (three) times daily. 12/08/22   Triplett, Tammy, PA-C  oxyCODONE-acetaminophen (PERCOCET/ROXICET) 5-325 MG tablet Take  1 tablet by mouth every 6 (six) hours as needed for severe pain. 11/14/22   Benjiman Core, MD      Allergies    Shellfish allergy    Review of Systems   Review of Systems  Constitutional:  Negative for fever.  HENT:  Positive for dental problem. Negative for facial swelling.   Gastrointestinal:  Negative for nausea and vomiting.  All other systems reviewed and are negative.   Physical Exam Updated Vital Signs BP (!) 190/91   Pulse (!) 45   Temp 98.9 F (37.2 C) (Oral)   Resp 14   Ht 6\' 1"  (1.854 m)   Wt 93 kg   SpO2 100%   BMI 27.05 kg/m  Physical Exam Vitals and nursing note reviewed.  Constitutional:      General: He is not in acute distress.    Appearance: Normal appearance. He is not ill-appearing, toxic-appearing or diaphoretic.  HENT:     Head: Normocephalic and atraumatic.     Nose: Nose normal.     Mouth/Throat:     Mouth: Mucous membranes are moist.     Pharynx: Oropharynx is clear.     Comments: Multiple dental caries throughout.  Uvula midline.  No abscess needing I&D.  Handling secretions appropriately, no drooling. Eyes:     Extraocular Movements: Extraocular movements intact.     Conjunctiva/sclera: Conjunctivae normal.     Pupils: Pupils are equal, round, and reactive to light.  Cardiovascular:  Rate and Rhythm: Normal rate and regular rhythm.  Pulmonary:     Effort: Pulmonary effort is normal.     Breath sounds: Normal breath sounds. No wheezing.  Abdominal:     General: Abdomen is flat. Bowel sounds are normal.     Palpations: Abdomen is soft.     Tenderness: There is no abdominal tenderness.  Musculoskeletal:     Cervical back: Normal range of motion and neck supple. No tenderness.  Skin:    General: Skin is warm and dry.     Capillary Refill: Capillary refill takes less than 2 seconds.  Neurological:     Mental Status: He is alert and oriented to person, place, and time.     ED Results / Procedures / Treatments   Labs (all labs  ordered are listed, but only abnormal results are displayed) Labs Reviewed - No data to display  EKG None  Radiology No results found.  Procedures Procedures    Medications Ordered in ED Medications - No data to display  ED Course/ Medical Decision Making/ A&P  Medical Decision Making  53 year old male presents for evaluation.  Please see HPI for further details.  On examination patient is afebrile and nontachycardic.  His lung sounds are clear bilaterally, he is not hypoxic.  Abdomen is soft and compressible throughout.  Neurological examinations at baseline.  He does have dental caries throughout.  No abscess needing drainage.  Uvula is midline, he is handling secretions appropriately.  No signs of Ludwig angina.  Will start patient on 7 days of Augmentin twice daily.  Will have him follow-up with dentist in the area.  Advised him to take Tylenol and ibuprofen alternating for pain control at home.  Will also send him home with Magic mouthwash.  Return precautions provided and he voiced understanding.  Stable to discharge home.  Of note, the patient is hypertensive here.  States he has not ever been on hypertension medication.  Will start him on 10 mg amlodipine and have him follow-up with his PCP.  We have encouraged him to purchase blood pressure cuff at home, record his measurements at home and keep a log.  He voiced understanding.   Final Clinical Impression(s) / ED Diagnoses Final diagnoses:  Dental infection  Asymptomatic hypertension    Rx / DC Orders ED Discharge Orders          Ordered    amoxicillin-clavulanate (AUGMENTIN) 875-125 MG tablet  Every 12 hours        01/18/24 1548    amLODipine (NORVASC) 10 MG tablet  Daily        01/18/24 1556    magic mouthwash (lidocaine, diphenhydrAMINE, alum & mag hydroxide) suspension  3 times daily        01/18/24 1557              Al Decant, PA-C 01/18/24 1558    Loetta Rough, MD 01/18/24 [phone]

## 2024-01-18 NOTE — ED Triage Notes (Signed)
Pt endorses right lower dental pain since Wednesday night. Pt reports broken tooth.

## 2024-01-18 NOTE — Discharge Instructions (Addendum)
It was a pleasure taking part in your care.  Please begin taking Augmentin twice a day for the next 7 days for your dental infection.  Please also utilize Magic mouthwash sent to your pharmacy.  Please swish and spit with this 3 times a day.  Please utilize dental resource guide to follow-up with dentist in the area. In terms of your blood pressure being elevated, I am starting you on a medication called amlodipine 10 mg.  Please take this once a day.  Please keep blood pressure log at home.  Please record your blood pressure once in the morning once at night.  You may also purchase a blood pressure cuff from [facility], [facility] or [facility].  Please return to the ED with any new symptoms such as chest pain, shortness of breath, headaches or blurred vision.

## 2024-01-31 NOTE — Progress Notes (Unsigned)
   New Patient Office Visit   Subjective   Patient ID: Tristan Schaefer, male    DOB: October 25, 1971  Age: 53 y.o. MRN: [id number]  CC: No chief complaint on file.   HPI Tristan Schaefer 53 year old male, presents to establish care. He  has a past medical history of Hypertension.  HPI    Outpatient Encounter Medications as of 02/01/2024  Medication Sig   amLODipine (NORVASC) 10 MG tablet Take 1 tablet (10 mg total) by mouth daily.   amoxicillin (AMOXIL) 500 MG capsule Take 1 capsule (500 mg total) by mouth 3 (three) times daily.   amoxicillin-clavulanate (AUGMENTIN) 875-125 MG tablet Take 1 tablet by mouth every 12 (twelve) hours.   HYDROcodone-acetaminophen (NORCO/VICODIN) 5-325 MG tablet Take 1 tablet by mouth every 6 (six) hours as needed for severe pain (pain score 7-10).   magic mouthwash (lidocaine, diphenhydrAMINE, alum & mag hydroxide) suspension Swish and spit 5 mLs 3 (three) times daily.   methocarbamol (ROBAXIN) 500 MG tablet Take 1 tablet (500 mg total) by mouth 3 (three) times daily.   oxyCODONE-acetaminophen (PERCOCET/ROXICET) 5-325 MG tablet Take 1 tablet by mouth every 6 (six) hours as needed for severe pain.   No facility-administered encounter medications on file as of 02/01/2024.    No past surgical history on file.  ROS    Objective    There were no vitals taken for this visit.  Physical Exam    Assessment & Plan:  There are no diagnoses linked to this encounter.  No follow-ups on file.   Cruzita Lederer Newman Nip, FNP

## 2024-01-31 NOTE — Patient Instructions (Signed)

## 2024-02-01 ENCOUNTER — Encounter: Payer: Medicaid Other | Admitting: Family Medicine

## 2024-02-01 DIAGNOSIS — E559 Vitamin D deficiency, unspecified: Secondary | ICD-10-CM

## 2024-02-01 DIAGNOSIS — R7301 Impaired fasting glucose: Secondary | ICD-10-CM

## 2024-02-01 DIAGNOSIS — N400 Enlarged prostate without lower urinary tract symptoms: Secondary | ICD-10-CM

## 2024-02-01 DIAGNOSIS — E038 Other specified hypothyroidism: Secondary | ICD-10-CM

## 2024-02-01 DIAGNOSIS — Z114 Encounter for screening for human immunodeficiency virus [HIV]: Secondary | ICD-10-CM

## 2024-02-01 DIAGNOSIS — Z1159 Encounter for screening for other viral diseases: Secondary | ICD-10-CM

## 2024-02-01 DIAGNOSIS — I10 Essential (primary) hypertension: Secondary | ICD-10-CM

## 2024-02-01 NOTE — Progress Notes (Signed)
 NO SHOW

## 2024-02-13 ENCOUNTER — Encounter: Payer: Self-pay | Admitting: General Practice

## 2024-03-03 ENCOUNTER — Ambulatory Visit (INDEPENDENT_AMBULATORY_CARE_PROVIDER_SITE_OTHER): Payer: Self-pay

## 2024-03-03 VITALS — BP 170/90 | HR 59 | Ht 73.0 in | Wt 217.0 lb

## 2024-03-03 DIAGNOSIS — F17200 Nicotine dependence, unspecified, uncomplicated: Secondary | ICD-10-CM | POA: Insufficient documentation

## 2024-03-03 DIAGNOSIS — I10 Essential (primary) hypertension: Secondary | ICD-10-CM | POA: Insufficient documentation

## 2024-03-03 DIAGNOSIS — M79672 Pain in left foot: Secondary | ICD-10-CM | POA: Insufficient documentation

## 2024-03-03 DIAGNOSIS — Z1211 Encounter for screening for malignant neoplasm of colon: Secondary | ICD-10-CM

## 2024-03-03 DIAGNOSIS — R079 Chest pain, unspecified: Secondary | ICD-10-CM

## 2024-03-03 DIAGNOSIS — M79671 Pain in right foot: Secondary | ICD-10-CM | POA: Diagnosis not present

## 2024-03-03 MED ORDER — AMLODIPINE BESYLATE 5 MG PO TABS: 5.0000 mg | ORAL_TABLET | Freq: Every day | ORAL | 0 refills | Status: DC

## 2024-03-03 NOTE — Patient Instructions (Signed)
 Referral placed for podiatrist (foot specialist).    New prescription for amlodipine 5 mg sent to your pharmacy.  This is the same medication that was prescribed in the ER, only a lower dose.  Recommend DASH diet and quitting/reduce smoking to also help with blood pressure control.    Recommend follow-up in 6-8 weeks to recheck blood pressure.

## 2024-03-03 NOTE — Assessment & Plan Note (Signed)
 No ischemic changes seen on EKG.  HR bradycardic, but he states that he has been evaluated for this in the past during hospitalization.

## 2024-03-03 NOTE — Assessment & Plan Note (Signed)
 Discussed how this can contribute to hypertension.  Advised cessation.

## 2024-03-03 NOTE — Assessment & Plan Note (Signed)
 Pt was recently prescribed amlodipine 10 mg in the ER, but he states that he didn't start this because he knew that he had an appointment with [place] scheduled.  BP was elevated when rechecked.  I have sent in a prescription for amlodipine 5 mg in place of the 10, and we will have him f/u in 6-8 weeks for recheck.  Will obtain labs at that time.

## 2024-03-03 NOTE — Progress Notes (Signed)
 New Patient Office Visit  Subjective    Patient ID: Tristan Schaefer, Tristan    DOB: [birth year]  Age: 53 y.o. MRN: [id number]  CC:  Chief Complaint  Patient presents with   Establish Care    New patient establishing care. Concerned about anxiety.     HPI Tristan Schaefer presents to establish care Is concerned about recent episode of chest pain and chronic foot pain  Outpatient Encounter Medications as of 03/03/2024  Medication Sig   amLODipine (NORVASC) 5 MG tablet Take 1 tablet (5 mg total) by mouth daily.   [DISCONTINUED] amLODipine (NORVASC) 10 MG tablet Take 1 tablet (10 mg total) by mouth daily.   [DISCONTINUED] amoxicillin (AMOXIL) 500 MG capsule Take 1 capsule (500 mg total) by mouth 3 (three) times daily.   [DISCONTINUED] amoxicillin-clavulanate (AUGMENTIN) 875-125 MG tablet Take 1 tablet by mouth every 12 (twelve) hours.   [DISCONTINUED] HYDROcodone-acetaminophen (NORCO/VICODIN) 5-325 MG tablet Take 1 tablet by mouth every 6 (six) hours as needed for severe pain (pain score 7-10).   [DISCONTINUED] magic mouthwash (lidocaine, diphenhydrAMINE, alum & mag hydroxide) suspension Swish and spit 5 mLs 3 (three) times daily.   [DISCONTINUED] methocarbamol (ROBAXIN) 500 MG tablet Take 1 tablet (500 mg total) by mouth 3 (three) times daily.   [DISCONTINUED] oxyCODONE-acetaminophen (PERCOCET/ROXICET) 5-325 MG tablet Take 1 tablet by mouth every 6 (six) hours as needed for severe pain.   No facility-administered encounter medications on file as of 03/03/2024.    Past Medical History:  Diagnosis Date   Hypertension     History reviewed. No pertinent surgical history.  History reviewed. No pertinent family history.  Social History   Socioeconomic History   Marital status: Single    Spouse name: Not on file   Number of children: Not on file   Years of education: Not on file   Highest education level: Not on file  Occupational History   Not on file  Tobacco Use   Smoking  status: Every Day    Types: Cigars   Smokeless tobacco: Never  Vaping Use   Vaping status: Never Used  Substance and Sexual Activity   Alcohol use: Not Currently   Drug use: Not Currently    Types: Marijuana   Sexual activity: Not on file  Other Topics Concern   Not on file  Social History Narrative   Not on file   Social Drivers of Health   Financial Resource Strain: Not on file  Food Insecurity: Not on file  Transportation Needs: Not on file  Physical Activity: Not on file  Stress: Not on file  Social Connections: Not on file  Intimate Partner Violence: Not on file    Review of Systems  Constitutional: Negative.   HENT: Negative.    Respiratory: Negative.    Cardiovascular:  Positive for chest pain. Negative for palpitations and leg swelling.  Musculoskeletal:  Positive for myalgias (bilat foot pain).  Neurological: Negative.   Psychiatric/Behavioral:  Negative for depression, substance abuse and suicidal ideas. The patient is nervous/anxious (d/t stress). The patient does not have insomnia.         Objective    BP (!) 170/90 (BP Location: Left Arm, Patient Position: Sitting, Cuff Size: Normal)   Pulse (!) 59   Ht 6\' 1"  (1.854 m)   Wt 217 lb 0.6 oz (98.4 kg)   SpO2 98%   BMI 28.63 kg/m   Physical Exam Vitals and nursing note reviewed.  Constitutional:  Appearance: Normal appearance.  HENT:     Right Ear: Tympanic membrane, ear canal and external ear normal.     Left Ear: Tympanic membrane, ear canal and external ear normal.     Nose: Nose normal.     Mouth/Throat:     Dentition: Dental caries present.     Pharynx: Oropharynx is clear.  Cardiovascular:     Rate and Rhythm: Regular rhythm. Bradycardia present.     Heart sounds: No murmur heard. Pulmonary:     Effort: Pulmonary effort is normal.     Breath sounds: Normal breath sounds.  Musculoskeletal:     Right foot: Tenderness present.     Left foot: Tenderness present.     Comments: Bilat  pes planus Calluses seen at base of 5th met-head bilaterally, possibly plantars warts  Neurological:     Mental Status: He is alert and oriented to person, place, and time.  Psychiatric:        Mood and Affect: Mood normal.        Thought Content: Thought content normal.         Assessment & Plan:   Problem List Items Addressed This Visit       Cardiovascular and Mediastinum   Hypertension (Chronic)   Pt was recently prescribed amlodipine 10 mg in the ER, but he states that he didn't start this because he knew that he had an appointment with [place] scheduled.  BP was elevated when rechecked.  I have sent in a prescription for amlodipine 5 mg in place of the 10, and we will have him f/u in 6-8 weeks for recheck.  Will obtain labs at that time.       Relevant Medications   amLODipine (NORVASC) 5 MG tablet     Other   Bilateral foot pain (Chronic)   Chronic in nature.  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  Referral placed to podiatry for further evaluation and treatment.        Relevant Orders   Ambulatory referral to Podiatry   Chest pain (Chronic)   No ischemic changes seen on EKG.  HR bradycardic, but he states that he has been evaluated for this in the past during hospitalization.        Relevant Orders   EKG 12-Lead (Completed)   Tobacco use disorder (Chronic)   Discussed how this can contribute to hypertension.  Advised cessation.       Other Visit Diagnoses       Colon cancer screening    -  Primary   Relevant Orders   Cologuard       No follow-ups on file.   Darral Dash, FNP

## 2024-03-03 NOTE — Assessment & Plan Note (Signed)
 Chronic in nature.  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  Referral placed to podiatry for further evaluation and treatment.

## 2024-03-05 IMAGING — DX DG CHEST 2V
2 series · 2 of 2 positions shown · non-contrast
Comparison: 11/21/2021

CLINICAL DATA: Cough

EXAM:
CHEST - 2 VIEW

[chest pa]
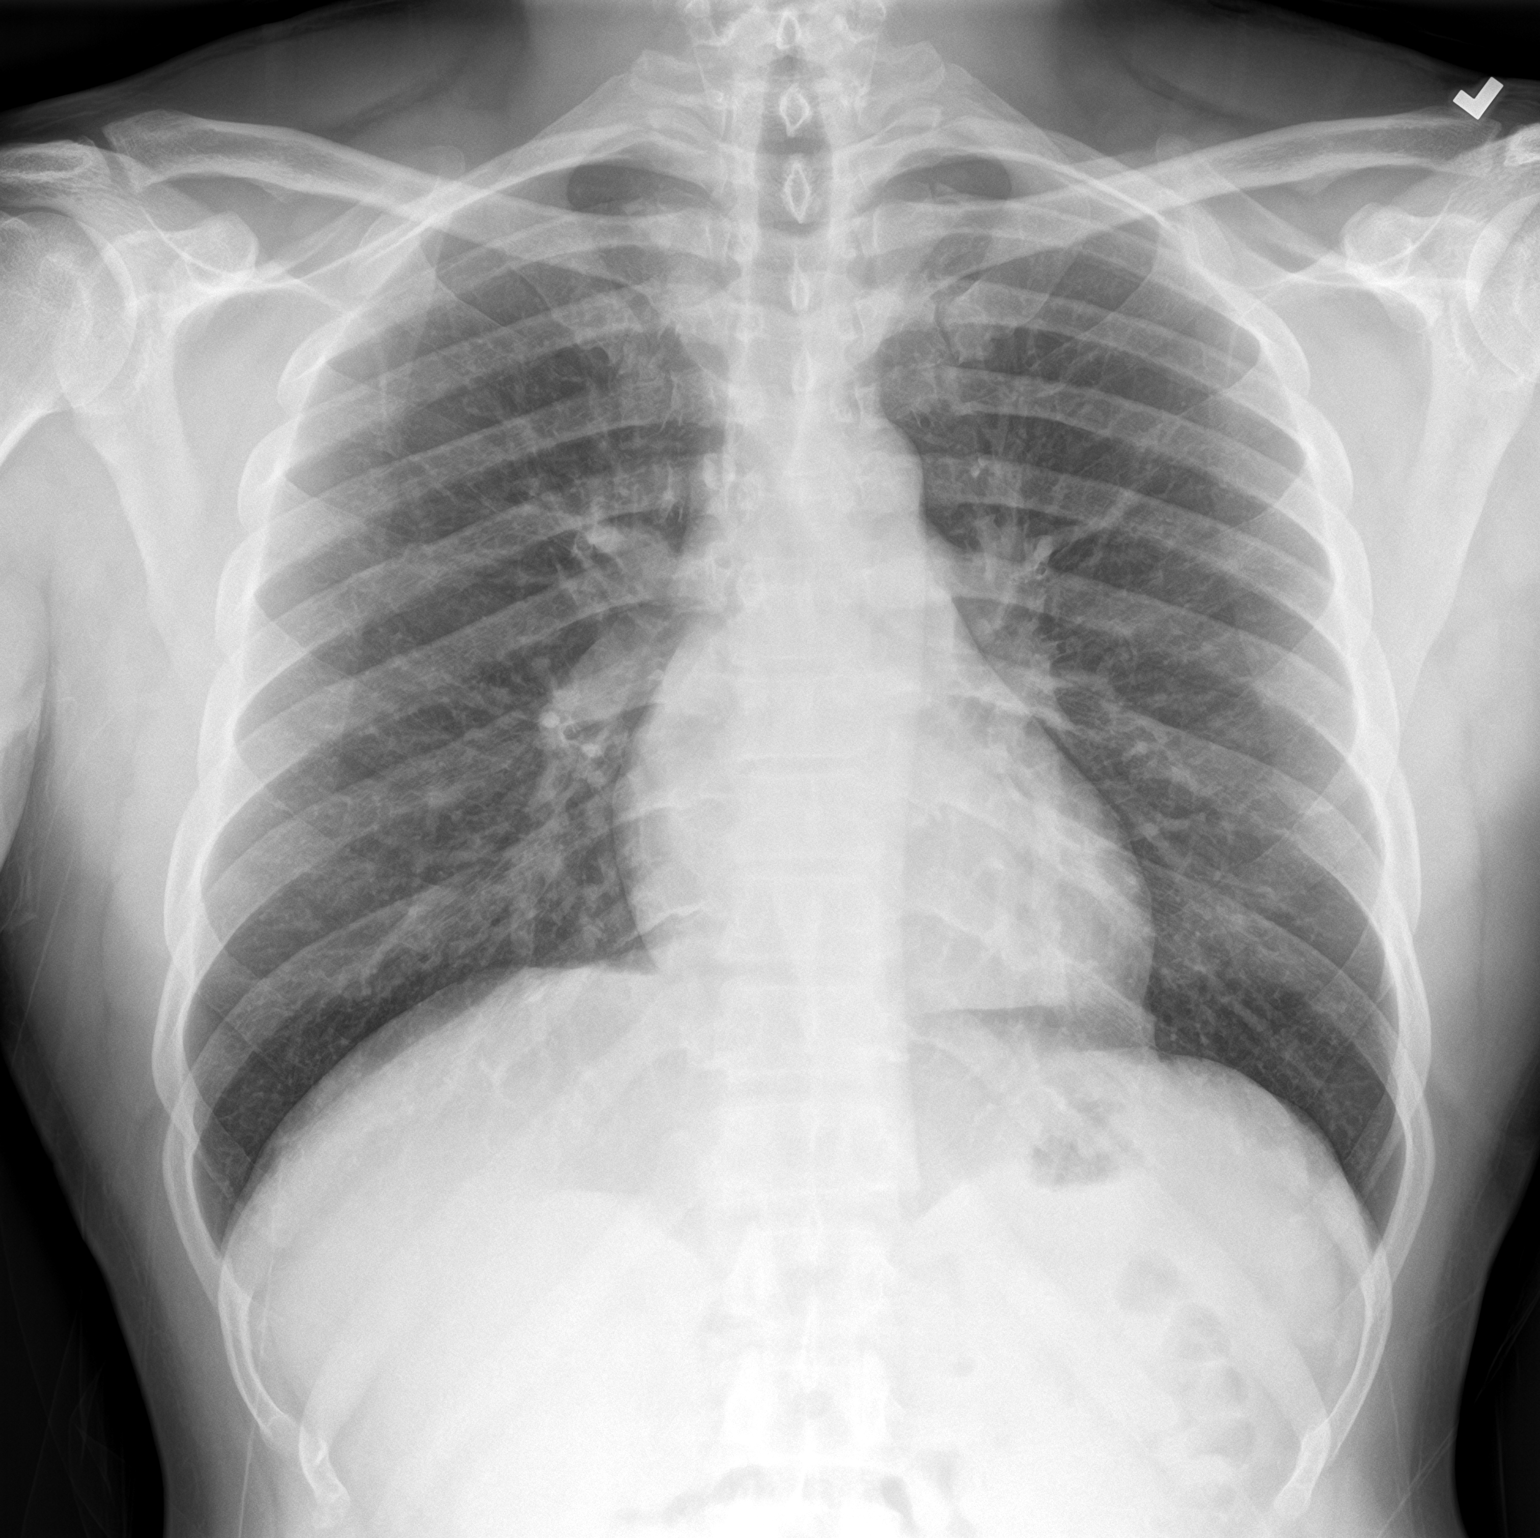

[chest lat]
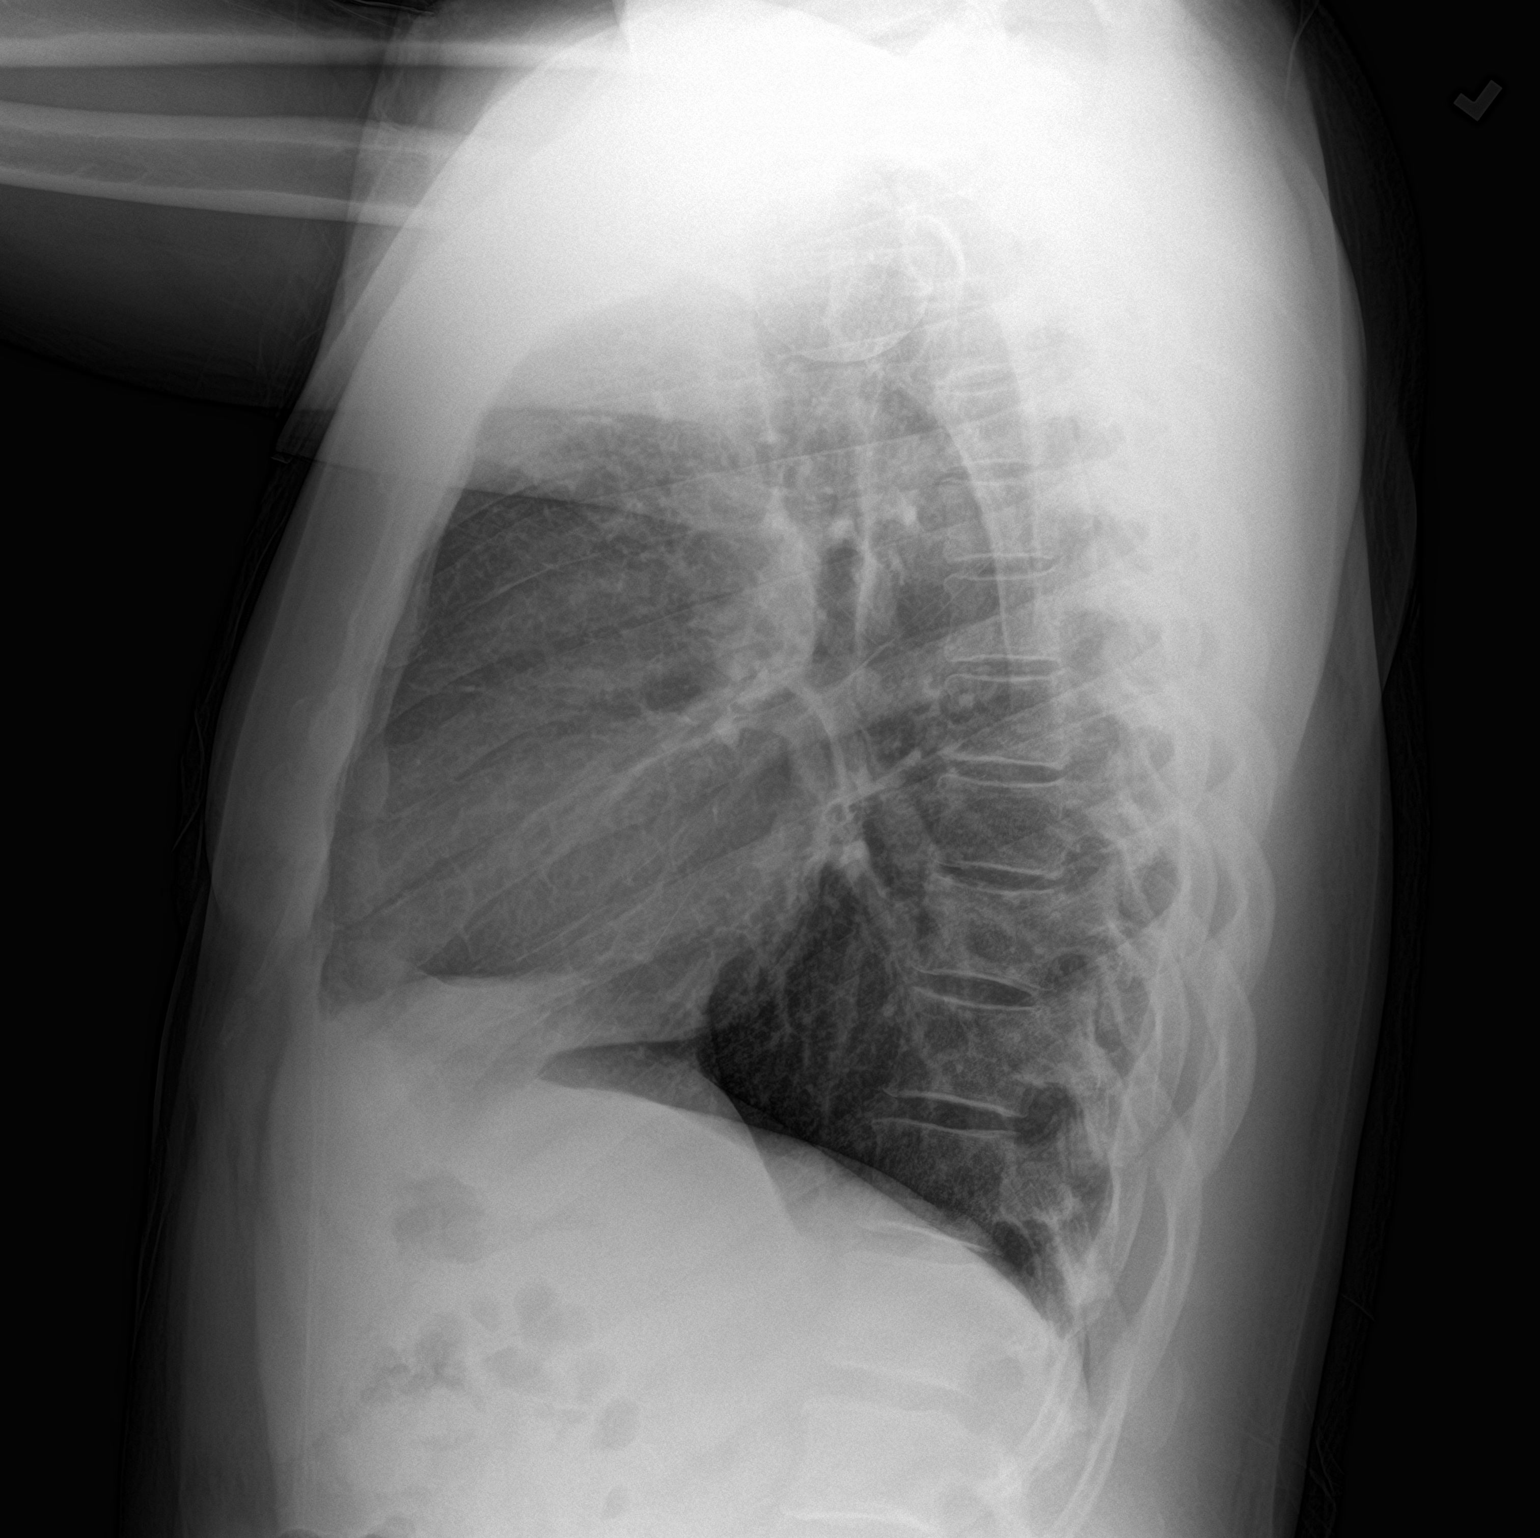

[2 of 2 positions shown; findings below may reference images not displayed]

FINDINGS: Cardiac and mediastinal contours are within normal limits. No focal
pulmonary opacity. No pleural effusion or pneumothorax. No acute
osseous abnormality.
IMPRESSION: No acute cardiopulmonary process.

## 2024-03-11 ENCOUNTER — Ambulatory Visit: Admitting: Podiatry

## 2024-03-11 ENCOUNTER — Ambulatory Visit

## 2024-03-11 ENCOUNTER — Encounter: Payer: Self-pay | Admitting: Podiatry

## 2024-03-11 DIAGNOSIS — D2372 Other benign neoplasm of skin of left lower limb, including hip: Secondary | ICD-10-CM

## 2024-03-11 DIAGNOSIS — B353 Tinea pedis: Secondary | ICD-10-CM

## 2024-03-11 DIAGNOSIS — Z79899 Other long term (current) drug therapy: Secondary | ICD-10-CM

## 2024-03-11 MED ORDER — CLOTRIMAZOLE-BETAMETHASONE 1-0.05 % EX CREA: 1.0000 | TOPICAL_CREAM | Freq: Every day | CUTANEOUS | 2 refills | Status: DC

## 2024-03-11 MED ORDER — TERBINAFINE HCL 250 MG PO TABS: 250.0000 mg | ORAL_TABLET | Freq: Every day | ORAL | 0 refills | Status: DC

## 2024-03-11 NOTE — Progress Notes (Signed)
   Chief Complaint  Patient presents with   Callouses    "These calluses on my feet.  The get so painful, it hurts to walk sometime." N - calluses L - 5th and hallux bilateral, 1st met right D - 10 years O - gradually worse C - sometime burn, sharp pain A - walking, standing T - soak them    Subjective: 53 y.o. male presenting to the office today for evaluation of bilateral foot pain secondary to skin conditions.  He says his nails are thick and discolored for several years.  He also has symptomatic skin lesions to the bilateral feet.   Past Medical History:  Diagnosis Date   Hypertension     No past surgical history on file.  Allergies  Allergen Reactions   Shellfish Allergy Hives     Objective:  Physical Exam General: Alert and oriented x3 in no acute distress  Dermatology: Hyperkeratotic lesion(s) present on the plantar aspect of bilateral forefoot. Pain on palpation with a central nucleated core noted. Skin is warm, dry and supple bilateral lower extremities. Negative for open lesions or macerations.  Hyperkeratosis of nails also noted bilateral with diffuse thin skin thickening consistent with chronic tinea pedis  Vascular: Palpable pedal pulses bilaterally. No edema or erythema noted. Capillary refill within normal limits.  Neurological: Grossly intact via light touch  Musculoskeletal Exam: Pain on palpation at the keratotic lesion(s) noted. Range of motion within normal limits bilateral. Muscle strength 5/5 in all groups bilateral.  Assessment: 1.  Symptomatic eccrine poroma bilateral feet 2.  Pain due to onychomycosis of toenails both 3.  Chronic tinea pedis bilateral   Plan of Care:  -Patient evaluated -Excisional debridement of keratoic lesion(s) using a chisel blade was performed without incident.  Cantharone applied with post care instructions provided -Prescription for Lotrisone cream apply twice daily -Today we discussed the pathology and etiology of  fungal nail infection.  Different treatment modalities were discussed.  Relative efficacies and risks and benefits associated with each modality explained.  Patient opts for oral Lamisil -Order placed for hepatic function panel. -Return to the clinic 3 weeks  * currently not working  Felecia Shelling, DPM [facility]  Dr. Felecia Shelling, DPM    [address] [place], [place] [address]                Office [phone]  Fax [phone]

## 2024-03-13 LAB — HEPATIC FUNCTION PANEL
ALT: 26 IU/L (ref 0–44)
AST: 22 IU/L (ref 0–40)
Albumin: 4.8 g/dL (ref 3.8–4.9)
Alkaline Phosphatase: 69 IU/L (ref 44–121)
Bilirubin Total: 0.3 mg/dL (ref 0.0–1.2)
Bilirubin, Direct: 0.1 mg/dL (ref 0.00–0.40)
Total Protein: 7.2 g/dL (ref 6.0–8.5)

## 2024-03-25 LAB — COLOGUARD: COLOGUARD: NEGATIVE

## 2024-04-01 ENCOUNTER — Encounter: Payer: Self-pay | Admitting: Podiatry

## 2024-04-01 ENCOUNTER — Ambulatory Visit (INDEPENDENT_AMBULATORY_CARE_PROVIDER_SITE_OTHER): Admitting: Podiatry

## 2024-04-01 DIAGNOSIS — D2372 Other benign neoplasm of skin of left lower limb, including hip: Secondary | ICD-10-CM

## 2024-04-01 DIAGNOSIS — B353 Tinea pedis: Secondary | ICD-10-CM | POA: Diagnosis not present

## 2024-04-01 NOTE — Progress Notes (Signed)
   Chief Complaint  Patient presents with   Callouses    "They hurt.  The first two weeks after he did them, they were fine."    Subjective: 53 y.o. male presenting to the office today for follow-up evaluation of bilateral foot pain and onychomycosis of the toenails bilateral.  Overall improvement.   Past Medical History:  Diagnosis Date   Hypertension     No past surgical history on file.  Allergies  Allergen Reactions   Shellfish Allergy Hives     Objective:  Physical Exam General: Alert and oriented x3 in no acute distress  Dermatology: Hyperkeratotic lesion(s) present on the plantar aspect of bilateral forefoot. Pain on palpation with a central nucleated core noted. Skin is warm, dry and supple bilateral lower extremities. Negative for open lesions or macerations.  Hyperkeratosis of nails also noted bilateral with diffuse thin skin thickening consistent with chronic tinea pedis  Vascular: Palpable pedal pulses bilaterally. No edema or erythema noted. Capillary refill within normal limits.  Neurological: Grossly intact via light touch  Musculoskeletal Exam: Pain on palpation at the keratotic lesion(s) noted. Range of motion within normal limits bilateral. Muscle strength 5/5 in all groups bilateral.  Assessment: 1.  Symptomatic eccrine poroma bilateral feet 2.  Pain due to onychomycosis of toenails both 3.  Chronic tinea pedis bilateral   Plan of Care:  -Patient evaluated -Excisional debridement of keratoic lesion(s) using a chisel blade was performed without incident.  Cantharone applied with post care instructions provided - Continue Lotrisone  cream twice daily -Continue oral Lamisil  to 50 mg #90 daily prescribed on 03/11/2024.  LFT 03/12/2024 WNL -Continue to refrain from going barefoot.  Recommend good supportive tennis shoes and sneakers -Return to clinic 1 month  * currently not working  Dot Gazella, DPM [facility]  Dr. Dot Gazella, DPM     [address] [place], [place] [address]                Office [phone]  Fax [phone]

## 2024-04-21 ENCOUNTER — Ambulatory Visit

## 2024-04-21 VITALS — BP 120/70 | HR 53 | Resp 18 | Ht 73.0 in | Wt 214.0 lb

## 2024-04-21 DIAGNOSIS — I10 Essential (primary) hypertension: Secondary | ICD-10-CM

## 2024-04-21 DIAGNOSIS — R079 Chest pain, unspecified: Secondary | ICD-10-CM | POA: Diagnosis not present

## 2024-04-21 NOTE — Progress Notes (Signed)
   Established Patient Office Visit  Subjective   Patient ID: Tristan Schaefer, male    DOB: 01/21/1971  Age: 53 y.o. MRN: [id number]  Chief Complaint  Patient presents with   Hypertension    6 week follow up. Still having burning sensation in chest with SOB occasionally. Started few months ago     HPI  Patient Active Problem List   Diagnosis Date Noted   Bilateral foot pain 03/03/2024   Chest pain 03/03/2024   Hypertension 03/03/2024   Tobacco use disorder 03/03/2024   Past Medical History:  Diagnosis Date   Hypertension      Review of Systems  Constitutional: Negative.   HENT: Negative.    Eyes: Negative.   Respiratory: Negative.    Cardiovascular:  Positive for chest pain.  Gastrointestinal: Negative.   Genitourinary: Negative.   Musculoskeletal: Negative.   Skin: Negative.   Neurological: Negative.   Psychiatric/Behavioral: Negative.        Objective:     BP 120/70 (BP Location: Left Arm, Patient Position: Sitting, Cuff Size: Normal)   Pulse (!) 53   Resp 18   Ht 6\' 1"  (1.854 m)   Wt 214 lb (97.1 kg)   SpO2 98%   BMI 28.23 kg/m  BP Readings from Last 3 Encounters:  04/21/24 120/70  03/03/24 (!) 170/90  01/18/24 (!) 190/91   Wt Readings from Last 3 Encounters:  04/21/24 214 lb (97.1 kg)  03/03/24 217 lb 0.6 oz (98.4 kg)  01/18/24 205 lb 0.4 oz (93 kg)      Physical Exam Vitals and nursing note reviewed.  Constitutional:      Appearance: Normal appearance.  Eyes:     Extraocular Movements: Extraocular movements intact.     Pupils: Pupils are equal, round, and reactive to light.  Cardiovascular:     Rate and Rhythm: Normal rate and regular rhythm.  Pulmonary:     Effort: Pulmonary effort is normal.     Breath sounds: Normal breath sounds.  Musculoskeletal:     Cervical back: Normal range of motion and neck supple.  Neurological:     Mental Status: He is alert and oriented to person, place, and time.  Psychiatric:        Mood and  Affect: Mood normal.        Thought Content: Thought content normal.      No results found for any visits on 04/21/24.    The ASCVD Risk score (Arnett DK, et al., 2019) failed to calculate for the following reasons:   Cannot find a previous HDL lab   Cannot find a previous total cholesterol lab    Assessment & Plan:   Problem List Items Addressed This Visit       Cardiovascular and Mediastinum   Hypertension - Primary (Chronic)   BP much improved with addition of amlodipine .  Continue to work on lifestyle changes including a healthy low-sodium diet and smoking cessation. Recommend f/u in 3 months.       Relevant Orders   Ambulatory referral to Cardiology     Other   Chest pain (Chronic)   Continues to have chest pain even though BP has improved.  Will refer to cardiology for further evaluation.       Relevant Orders   Ambulatory referral to Cardiology    Return in about 3 months (around 07/22/2024).    Alison Irvine, FNP

## 2024-04-23 NOTE — Assessment & Plan Note (Signed)
 BP much improved with addition of amlodipine .  Continue to work on lifestyle changes including a healthy low-sodium diet and smoking cessation. Recommend f/u in 3 months.

## 2024-04-23 NOTE — Assessment & Plan Note (Signed)
 Continues to have chest pain even though BP has improved.  Will refer to cardiology for further evaluation.

## 2024-04-29 ENCOUNTER — Encounter: Payer: Self-pay | Admitting: Podiatry

## 2024-04-29 ENCOUNTER — Ambulatory Visit: Admitting: Podiatry

## 2024-04-29 VITALS — Ht 73.0 in | Wt 214.0 lb

## 2024-04-29 DIAGNOSIS — D2372 Other benign neoplasm of skin of left lower limb, including hip: Secondary | ICD-10-CM | POA: Diagnosis not present

## 2024-04-29 NOTE — Progress Notes (Signed)
   Chief Complaint  Patient presents with   Callouses    Pt is here to f/u on bilateral callus on feet, pt states the one on left foot is bothering him believes it may have been shave down too much right foot is fine. No other complaints.    Subjective: 53 y.o. male presenting to the office today for follow-up evaluation of symptomatic skin lesions to the bilateral feet.  Continued improvement   Past Medical History:  Diagnosis Date   Hypertension     No past surgical history on file.  Allergies  Allergen Reactions   Shellfish Allergy Hives     Objective:  Physical Exam General: Alert and oriented x3 in no acute distress  Dermatology: There continues to be hyperkeratotic lesion(s) present on the plantar aspect of bilateral forefoot. Pain on palpation with a central nucleated core noted. Skin is warm, dry and supple bilateral lower extremities. Negative for open lesions or macerations.  Hyperkeratosis of nails also noted bilateral with diffuse thin skin thickening consistent with chronic tinea pedis  Vascular: Palpable pedal pulses bilaterally. No edema or erythema noted. Capillary refill within normal limits.  Neurological: Grossly intact via light touch  Musculoskeletal Exam: Pain on palpation at the keratotic lesion(s) noted. Range of motion within normal limits bilateral. Muscle strength 5/5 in all groups bilateral.  Assessment: 1.  Symptomatic eccrine poroma bilateral feet 2.  Pain due to onychomycosis of toenails both 3.  Chronic tinea pedis bilateral   Plan of Care:  -Patient evaluated -Excisional debridement of keratoic lesion(s) using a chisel blade was performed without incident.  Cantharone applied with post care instructions provided - Continue Lotrisone  cream twice daily -Continue oral Lamisil  to 50 mg #90 daily prescribed on 03/11/2024.  LFT 03/12/2024 WNL -Continue to refrain from going barefoot.  Recommend good supportive tennis shoes and sneakers -Return to  clinic 1 month  * currently not working. Talked about his garden  Dot Gazella, DPM [facility]  Dr. Dot Gazella, DPM    [address] [place], [place] [address]                Office [phone]  Fax [phone]

## 2024-05-27 ENCOUNTER — Ambulatory Visit (INDEPENDENT_AMBULATORY_CARE_PROVIDER_SITE_OTHER): Admitting: Podiatry

## 2024-05-27 ENCOUNTER — Encounter: Payer: Self-pay | Admitting: Podiatry

## 2024-05-27 VITALS — Ht 73.0 in | Wt 214.0 lb

## 2024-05-27 DIAGNOSIS — D2372 Other benign neoplasm of skin of left lower limb, including hip: Secondary | ICD-10-CM

## 2024-05-27 NOTE — Progress Notes (Unsigned)
   No chief complaint on file.   Subjective: 53 y.o. male presenting to the office today for follow-up evaluation of symptomatic skin lesions to the bilateral feet.  Continued improvement   Past Medical History:  Diagnosis Date   Hypertension     No past surgical history on file.  Allergies  Allergen Reactions   Shellfish Allergy Hives     Objective:  Physical Exam General: Alert and oriented x3 in no acute distress  Dermatology: There continues to be hyperkeratotic lesion(s) present on the plantar aspect of bilateral forefoot. Pain on palpation with a central nucleated core noted. Skin is warm, dry and supple bilateral lower extremities. Negative for open lesions or macerations.  Hyperkeratosis of nails also noted bilateral with diffuse thin skin thickening consistent with chronic tinea pedis  Vascular: Palpable pedal pulses bilaterally. No edema or erythema noted. Capillary refill within normal limits.  Neurological: Grossly intact via light touch  Musculoskeletal Exam: Pain on palpation at the keratotic lesion(s) noted. Range of motion within normal limits bilateral. Muscle strength 5/5 in all groups bilateral.  Assessment: 1.  Symptomatic eccrine poroma bilateral feet 2.  Pain due to onychomycosis of toenails both 3.  Chronic tinea pedis bilateral   Plan of Care:  -Patient evaluated -Excisional debridement of keratoic lesion(s) using a chisel blade was performed without incident.  Cantharone applied with post care instructions provided - Continue Lotrisone  cream twice daily -Continue oral Lamisil  to 50 mg #90 daily prescribed on 03/11/2024.  LFT 03/12/2024 WNL -Continue to refrain from going barefoot.  Recommend good supportive tennis shoes and sneakers -Return to clinic 1 month  * currently not working. Talked about his garden  [name], DPM [facility]  Dr. [name], DPM    [address] [place], [place] [address]                Office [phone]  Fax [phone]    ,

## 2024-06-16 HISTORY — PX: MULTIPLE TOOTH EXTRACTIONS: SHX2053

## 2024-07-01 ENCOUNTER — Ambulatory Visit (INDEPENDENT_AMBULATORY_CARE_PROVIDER_SITE_OTHER): Admitting: Podiatry

## 2024-07-01 ENCOUNTER — Encounter: Payer: Self-pay | Admitting: Podiatry

## 2024-07-01 VITALS — Ht 73.0 in | Wt 214.0 lb

## 2024-07-01 DIAGNOSIS — B353 Tinea pedis: Secondary | ICD-10-CM

## 2024-07-01 DIAGNOSIS — B351 Tinea unguium: Secondary | ICD-10-CM | POA: Diagnosis not present

## 2024-07-01 DIAGNOSIS — D2372 Other benign neoplasm of skin of left lower limb, including hip: Secondary | ICD-10-CM

## 2024-07-01 MED ORDER — CLOTRIMAZOLE-BETAMETHASONE 1-0.05 % EX CREA: 1.0000 | TOPICAL_CREAM | Freq: Every day | CUTANEOUS | 2 refills | Status: AC

## 2024-07-01 MED ORDER — TERBINAFINE HCL 250 MG PO TABS: 250.0000 mg | ORAL_TABLET | Freq: Every day | ORAL | 0 refills | Status: DC

## 2024-07-01 NOTE — Progress Notes (Signed)
   Chief Complaint  Patient presents with   Callouses    Pt is here to f/u on bilateral due to callous.    Subjective: 53 y.o. male presenting to the office today for follow-up evaluation of symptomatic skin lesions to the bilateral feet.  Continued improvement   Past Medical History:  Diagnosis Date   Hypertension     No past surgical history on file.  Allergies  Allergen Reactions   Shellfish Allergy Hives     Objective:  Physical Exam General: Alert and oriented x3 in no acute distress  Dermatology: Overall improvement however there continues to be hyperkeratotic lesion(s) present on the plantar aspect of bilateral forefoot. Pain on palpation with a central nucleated core noted. Skin is warm, dry and supple bilateral lower extremities. Negative for open lesions or macerations.  Hyperkeratosis of nails also noted bilateral with diffuse thin skin thickening consistent with chronic tinea pedis  Vascular: Palpable pedal pulses bilaterally. No edema or erythema noted. Capillary refill within normal limits.  Neurological: Grossly intact via light touch  Musculoskeletal Exam: Minimal tenderness on palpation at the keratotic lesion(s) noted. Range of motion within normal limits bilateral. Muscle strength 5/5 in all groups bilateral.  Assessment: 1.  Symptomatic eccrine poroma bilateral feet 2.  Onychomycosis of toenails bilateral 3.  Chronic tinea pedis bilateral  Plan of Care:  -Patient evaluated -Excisional debridement of keratoic lesion(s) using a chisel blade was performed without incident.  Salicylic acid applied with post care instructions provided -recommend salicylic acid daily Available at any pharmacy OTC - Continue Lotrisone  cream twice daily -Continue oral Lamisil  250 mg #90 daily.  Patient tolerating well.  Refill provided -Continue to refrain from going barefoot.  Recommend good supportive tennis shoes and sneakers -Return to clinic 1 month  * currently not  working. Talked about his garden  [name], DPM [facility]  Dr. [name], DPM    [address] [place], [place] [address]                Office [phone]  Fax [phone]    ,

## 2024-07-14 ENCOUNTER — Encounter: Payer: Self-pay | Admitting: Internal Medicine

## 2024-07-14 ENCOUNTER — Ambulatory Visit: Attending: Internal Medicine | Admitting: Internal Medicine

## 2024-07-14 VITALS — BP 144/80 | HR 45 | Ht 73.0 in | Wt 222.0 lb

## 2024-07-14 DIAGNOSIS — R079 Chest pain, unspecified: Secondary | ICD-10-CM | POA: Insufficient documentation

## 2024-07-14 DIAGNOSIS — I10 Essential (primary) hypertension: Secondary | ICD-10-CM | POA: Insufficient documentation

## 2024-07-14 DIAGNOSIS — R001 Bradycardia, unspecified: Secondary | ICD-10-CM | POA: Diagnosis not present

## 2024-07-14 NOTE — Patient Instructions (Addendum)
 Medication Instructions:  Your physician recommends that you continue on your current medications as directed. Please refer to the Current Medication list given to you today.   Labwork: None  Testing/Procedures: Your physician has requested that you have an exercise tolerance test. For further information please visit [email or web address] Please also follow instruction sheet, as given.   Follow-Up: Your physician recommends that you schedule a follow-up appointment in: 3 months  Any Other Special Instructions Will Be Listed Below (If Applicable). Thank you for choosing [facility]!     If you need a refill on your cardiac medications before your next appointment, please call your pharmacy.

## 2024-07-14 NOTE — Progress Notes (Signed)
 Cardiology Office Note  Date: 07/14/2024   ID: STRATTON VILLWOCK, DOB 17-Sep-1971, MRN [id number]  PCP:  Bevely Doffing, FNP  Cardiologist:  Diannah SHAUNNA Maywood, MD Electrophysiologist:  None   History of Present Illness: Tristan Schaefer is a 53 y.o. male  Referred to cardiology clinic for evaluation of chest pain.  Chest pain occurs at rest and not with exertion.  He is extremely active at baseline.  He works out.  Sometimes chest pains are partially relieved by burping.  Could be GI related.  No dizziness, lightheadedness, syncope, severe fatigue.  No leg swelling.  He stopped taking amlodipine  and he wants to try natural remedies for the management of his blood pressure.  He is currently taking beet juice.  He checks blood pressures at home, ranges around 130 mmHg but he does not check blood pressures frequently.  Past Medical History:  Diagnosis Date   Hypertension     Past Surgical History:  Procedure Laterality Date   MULTIPLE TOOTH EXTRACTIONS  06/16/2024    Current Outpatient Medications  Medication Sig Dispense Refill   clotrimazole -betamethasone  (LOTRISONE ) cream Apply 1 Application topically daily. 45 g 2   amLODipine  (NORVASC ) 10 MG tablet Take 10 mg by mouth daily. (Patient not taking: Reported on 07/14/2024)     No current facility-administered medications for this visit.   Allergies:  Shellfish allergy   Social History: The patient  reports that he has quit smoking. His smoking use included cigars. He has never used smokeless tobacco. He reports that he does not currently use alcohol. He reports that he does not currently use drugs after having used the following drugs: Marijuana.   Family History: The patient's family history includes Hypertension in his mother; Pneumonia in his father.   ROS:  Please see the history of present illness. Otherwise, complete review of systems is positive for none  All other systems are reviewed and negative.   Physical  Exam: VS:  BP (!) 144/80 (BP Location: Right Arm, Cuff Size: Large) Comment (BP Location): manual recheck  Pulse (!) 45   Ht 6' 1 (1.854 m)   Wt 222 lb (100.7 kg)   BMI 29.29 kg/m , BMI Body mass index is 29.29 kg/m.  Wt Readings from Last 3 Encounters:  07/14/24 222 lb (100.7 kg)  07/01/24 214 lb (97.1 kg)  05/27/24 214 lb (97.1 kg)    General: Patient appears comfortable at rest. HEENT: Conjunctiva and lids normal, oropharynx clear with moist mucosa. Neck: Supple, no elevated JVP or carotid bruits, no thyromegaly. Lungs: Clear to auscultation, nonlabored breathing at rest. Cardiac: Regular rate and rhythm, no S3 or significant systolic murmur, no pericardial rub. Abdomen: Soft, nontender, no hepatomegaly, bowel sounds present, no guarding or rebound. Extremities: No pitting edema, distal pulses 2+. Skin: Warm and dry. Musculoskeletal: No kyphosis. Neuropsychiatric: Alert and oriented x3, affect grossly appropriate.  Recent Labwork: 03/12/2024: ALT 26; AST 22  No results found for: CHOL, TRIG, HDL, CHOLHDL, VLDL, LDLCALC, LDLDIRECT   Assessment and Plan:  Chest pain - Chest pain occurs only at rest and not with exertion.  Sometimes partially relieves with burping. - Likely secondary to poorly controlled HTN versus noncardiac (GI related) - Home blood pressures range around 130 mmHg and in the clinic today it is 140 mmHg. - Obtain ETT to assess HTN response.  HTN, poorly controlled - Does not take amlodipine .  He wants to try natural remedies, currently taking beet juice. - Will obtain ETT to assess  HTN response to stress. - He is agreeable to taking antihypertensive medications if blood pressure is significantly elevated on ETT.  Severe sinus bradycardia - EKG today showed severe sinus bradycardia with HR 45 bpm.  Does not have any symptoms of dizziness, lightheadedness, syncope.  No severe fatigue. - He is extremely active at baseline. - No further cardiac  workup is indicated at this time.   Reviewed PCP records, more than 3 labs.  Discussed the importance of HTN management.  45 minutes spent in reviewing the records, labs, test, imaging, discussing with the patient and documentation.  Medication Adjustments/Labs and Tests Ordered: Current medicines are reviewed at length with the patient today.  Concerns regarding medicines are outlined above.    Disposition:  Follow up 3 months  Signed Herman Fiero Priya Warren Lindahl, MD, 07/14/2024 3:27 PM    [address] [facility] [address] [place], [place], [place] [address]'

## 2024-07-21 ENCOUNTER — Ambulatory Visit (HOSPITAL_COMMUNITY)
Admission: RE | Admit: 2024-07-21 | Discharge: 2024-07-21 | Disposition: A | Discharge status: Home / Self Care | Source: Ambulatory Visit | Attending: Internal Medicine | Admitting: Internal Medicine

## 2024-07-21 ENCOUNTER — Other Ambulatory Visit: Payer: Self-pay | Admitting: Physician Assistant

## 2024-07-21 DIAGNOSIS — R079 Chest pain, unspecified: Secondary | ICD-10-CM | POA: Insufficient documentation

## 2024-07-21 LAB — EXERCISE TOLERANCE TEST
Angina Index: 0
Base ST Depression (mm): 0 mm
Estimated workload: 11.9
Exercise duration (min): 10 min
Exercise duration (sec): 4 s
MPHR: 169 {beats}/min
Peak HR: 148 {beats}/min
Percent HR: 87 %
RPE: 12
Rest HR: 48 {beats}/min

## 2024-07-21 NOTE — Progress Notes (Signed)
     Tristan Schaefer presented for a treadmill stress test (GXT) today.  I Lorette CINDERELLA Kapur, PA-C, provided direct supervision and was present during the study today, which was completed without significant symptoms, immediate complications, or acute ST/T changes on ECG.  Official results are pending at this time.  Preliminary ECG findings may be listed in the chart, but the stress test result will not be finalized until read by the Cardiologist.  Lorette CINDERELLA Kapur, PA-C  07/21/2024, 10:11 AM

## 2024-07-22 ENCOUNTER — Ambulatory Visit

## 2024-07-24 ENCOUNTER — Ambulatory Visit: Payer: Self-pay | Admitting: Internal Medicine

## 2024-07-29 ENCOUNTER — Ambulatory Visit: Admitting: Podiatry

## 2024-08-18 NOTE — Telephone Encounter (Signed)
 The patient has been notified of the result and verbalized understanding.  All questions (if any) were answered. Advised patient ok per Dr. Mallipeddi to take Amlodipine  10 mg once daily. [name], CMA 08/18/2024 1:59 PM

## 2024-08-18 NOTE — Telephone Encounter (Signed)
-----   Message from Vishnu P Mallipeddi sent at 07/24/2024  3:59 PM EDT ----- Abnormal ETT with positive EKG changes for ischemia (likely secondary to poorly controlled HTN) and hypertensive BP response.  No angina during the stress test which is reassuring.  Patient wanted to  try natural remedies to cure hypertension.  Strongly encourage patient to start taking amlodipine  5 mg twice daily. ----- Message ----- From: Mallipeddi, Vishnu P, MD Sent: 07/21/2024   2:44 PM EDT To: Vishnu P Mallipeddi, MD

## 2024-10-06 ENCOUNTER — Encounter: Payer: Self-pay | Admitting: Internal Medicine

## 2024-10-06 ENCOUNTER — Ambulatory Visit: Attending: Internal Medicine | Admitting: Internal Medicine

## 2024-10-06 NOTE — Progress Notes (Signed)
 Erroneous encounter - please disregard.
